# Patient Record
Sex: Female | Born: 1980 | Race: White | Hispanic: No | Marital: Married | Smoking: Former smoker
Health system: Southern US, Community
[De-identification: ages and names within clinical notes are randomized; demographics above are authoritative.]

## PROBLEM LIST (undated history)

## (undated) DIAGNOSIS — F988 Other specified behavioral and emotional disorders with onset usually occurring in childhood and adolescence: Secondary | ICD-10-CM

## (undated) DIAGNOSIS — F32A Depression, unspecified: Secondary | ICD-10-CM

## (undated) DIAGNOSIS — B029 Zoster without complications: Secondary | ICD-10-CM

## (undated) DIAGNOSIS — F419 Anxiety disorder, unspecified: Secondary | ICD-10-CM

## (undated) DIAGNOSIS — F329 Major depressive disorder, single episode, unspecified: Secondary | ICD-10-CM

## (undated) DIAGNOSIS — G4700 Insomnia, unspecified: Secondary | ICD-10-CM

## (undated) HISTORY — DX: Depression, unspecified: F32.A

## (undated) HISTORY — DX: Other specified behavioral and emotional disorders with onset usually occurring in childhood and adolescence: F98.8

## (undated) HISTORY — DX: Zoster without complications: B02.9

## (undated) HISTORY — PX: TONSILLECTOMY: SUR1361

## (undated) HISTORY — PX: ABDOMINAL HYSTERECTOMY: SHX81

## (undated) HISTORY — DX: Anxiety disorder, unspecified: F41.9

## (undated) HISTORY — DX: Insomnia, unspecified: G47.00

---
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
## 2015-09-09 DIAGNOSIS — R21 Rash and other nonspecific skin eruption: Secondary | ICD-10-CM | POA: Diagnosis not present

## 2015-09-09 DIAGNOSIS — B029 Zoster without complications: Secondary | ICD-10-CM | POA: Diagnosis not present

## 2015-10-06 DIAGNOSIS — F909 Attention-deficit hyperactivity disorder, unspecified type: Secondary | ICD-10-CM | POA: Diagnosis not present

## 2015-10-06 DIAGNOSIS — I509 Heart failure, unspecified: Secondary | ICD-10-CM | POA: Diagnosis not present

## 2015-10-06 DIAGNOSIS — M545 Low back pain: Secondary | ICD-10-CM | POA: Diagnosis not present

## 2015-10-06 DIAGNOSIS — N200 Calculus of kidney: Secondary | ICD-10-CM | POA: Diagnosis not present

## 2015-10-06 DIAGNOSIS — S335XXA Sprain of ligaments of lumbar spine, initial encounter: Secondary | ICD-10-CM | POA: Diagnosis not present

## 2015-10-07 DIAGNOSIS — F909 Attention-deficit hyperactivity disorder, unspecified type: Secondary | ICD-10-CM | POA: Diagnosis not present

## 2015-10-07 DIAGNOSIS — M545 Low back pain: Secondary | ICD-10-CM | POA: Diagnosis not present

## 2015-10-18 DIAGNOSIS — F988 Other specified behavioral and emotional disorders with onset usually occurring in childhood and adolescence: Secondary | ICD-10-CM | POA: Diagnosis not present

## 2015-10-18 DIAGNOSIS — B029 Zoster without complications: Secondary | ICD-10-CM | POA: Diagnosis not present

## 2015-10-18 DIAGNOSIS — Z6822 Body mass index (BMI) 22.0-22.9, adult: Secondary | ICD-10-CM | POA: Diagnosis not present

## 2015-11-29 DIAGNOSIS — S025XXA Fracture of tooth (traumatic), initial encounter for closed fracture: Secondary | ICD-10-CM | POA: Diagnosis not present

## 2015-12-11 DIAGNOSIS — F411 Generalized anxiety disorder: Secondary | ICD-10-CM | POA: Diagnosis not present

## 2015-12-11 DIAGNOSIS — F988 Other specified behavioral and emotional disorders with onset usually occurring in childhood and adolescence: Secondary | ICD-10-CM | POA: Diagnosis not present

## 2015-12-11 DIAGNOSIS — F329 Major depressive disorder, single episode, unspecified: Secondary | ICD-10-CM | POA: Diagnosis not present

## 2015-12-11 DIAGNOSIS — Z6823 Body mass index (BMI) 23.0-23.9, adult: Secondary | ICD-10-CM | POA: Diagnosis not present

## 2015-12-25 DIAGNOSIS — J209 Acute bronchitis, unspecified: Secondary | ICD-10-CM | POA: Diagnosis not present

## 2016-01-01 DIAGNOSIS — B029 Zoster without complications: Secondary | ICD-10-CM | POA: Diagnosis not present

## 2016-01-01 DIAGNOSIS — F329 Major depressive disorder, single episode, unspecified: Secondary | ICD-10-CM | POA: Diagnosis not present

## 2016-01-01 DIAGNOSIS — F411 Generalized anxiety disorder: Secondary | ICD-10-CM | POA: Diagnosis not present

## 2016-01-01 DIAGNOSIS — F988 Other specified behavioral and emotional disorders with onset usually occurring in childhood and adolescence: Secondary | ICD-10-CM | POA: Diagnosis not present

## 2016-01-17 DIAGNOSIS — F988 Other specified behavioral and emotional disorders with onset usually occurring in childhood and adolescence: Secondary | ICD-10-CM | POA: Diagnosis not present

## 2016-01-17 DIAGNOSIS — M549 Dorsalgia, unspecified: Secondary | ICD-10-CM | POA: Diagnosis not present

## 2016-01-17 DIAGNOSIS — Z6823 Body mass index (BMI) 23.0-23.9, adult: Secondary | ICD-10-CM | POA: Diagnosis not present

## 2016-01-17 DIAGNOSIS — F418 Other specified anxiety disorders: Secondary | ICD-10-CM | POA: Diagnosis not present

## 2016-01-31 DIAGNOSIS — S335XXA Sprain of ligaments of lumbar spine, initial encounter: Secondary | ICD-10-CM | POA: Diagnosis not present

## 2016-02-02 DIAGNOSIS — B029 Zoster without complications: Secondary | ICD-10-CM | POA: Diagnosis not present

## 2016-02-02 DIAGNOSIS — R21 Rash and other nonspecific skin eruption: Secondary | ICD-10-CM | POA: Diagnosis not present

## 2016-02-26 DIAGNOSIS — F411 Generalized anxiety disorder: Secondary | ICD-10-CM | POA: Diagnosis not present

## 2016-02-26 DIAGNOSIS — F329 Major depressive disorder, single episode, unspecified: Secondary | ICD-10-CM | POA: Diagnosis not present

## 2016-02-26 DIAGNOSIS — F988 Other specified behavioral and emotional disorders with onset usually occurring in childhood and adolescence: Secondary | ICD-10-CM | POA: Diagnosis not present

## 2016-02-26 DIAGNOSIS — Z6823 Body mass index (BMI) 23.0-23.9, adult: Secondary | ICD-10-CM | POA: Diagnosis not present

## 2016-03-08 DIAGNOSIS — S93401A Sprain of unspecified ligament of right ankle, initial encounter: Secondary | ICD-10-CM | POA: Diagnosis not present

## 2016-03-10 DIAGNOSIS — S93401D Sprain of unspecified ligament of right ankle, subsequent encounter: Secondary | ICD-10-CM | POA: Diagnosis not present

## 2016-03-15 DIAGNOSIS — S93401A Sprain of unspecified ligament of right ankle, initial encounter: Secondary | ICD-10-CM | POA: Diagnosis not present

## 2016-03-15 DIAGNOSIS — S8991XA Unspecified injury of right lower leg, initial encounter: Secondary | ICD-10-CM | POA: Diagnosis not present

## 2016-03-20 DIAGNOSIS — S82811A Torus fracture of upper end of right fibula, initial encounter for closed fracture: Secondary | ICD-10-CM | POA: Diagnosis not present

## 2016-03-20 DIAGNOSIS — S93401A Sprain of unspecified ligament of right ankle, initial encounter: Secondary | ICD-10-CM | POA: Diagnosis not present

## 2016-03-28 DIAGNOSIS — S93401S Sprain of unspecified ligament of right ankle, sequela: Secondary | ICD-10-CM | POA: Diagnosis not present

## 2016-04-03 DIAGNOSIS — S93431D Sprain of tibiofibular ligament of right ankle, subsequent encounter: Secondary | ICD-10-CM | POA: Diagnosis not present

## 2016-04-03 DIAGNOSIS — S82811D Torus fracture of upper end of right fibula, subsequent encounter for fracture with routine healing: Secondary | ICD-10-CM | POA: Diagnosis not present

## 2016-09-04 DIAGNOSIS — J019 Acute sinusitis, unspecified: Secondary | ICD-10-CM | POA: Diagnosis not present

## 2016-09-04 DIAGNOSIS — Z6823 Body mass index (BMI) 23.0-23.9, adult: Secondary | ICD-10-CM | POA: Diagnosis not present

## 2016-09-17 DIAGNOSIS — S8991XA Unspecified injury of right lower leg, initial encounter: Secondary | ICD-10-CM | POA: Diagnosis not present

## 2016-09-17 DIAGNOSIS — M25561 Pain in right knee: Secondary | ICD-10-CM | POA: Diagnosis not present

## 2016-09-17 DIAGNOSIS — M1711 Unilateral primary osteoarthritis, right knee: Secondary | ICD-10-CM | POA: Diagnosis not present

## 2016-09-17 DIAGNOSIS — M7989 Other specified soft tissue disorders: Secondary | ICD-10-CM | POA: Diagnosis not present

## 2016-09-17 DIAGNOSIS — S8981XA Other specified injuries of right lower leg, initial encounter: Secondary | ICD-10-CM | POA: Diagnosis not present

## 2016-10-03 DIAGNOSIS — F988 Other specified behavioral and emotional disorders with onset usually occurring in childhood and adolescence: Secondary | ICD-10-CM | POA: Diagnosis not present

## 2016-10-03 DIAGNOSIS — F411 Generalized anxiety disorder: Secondary | ICD-10-CM | POA: Diagnosis not present

## 2016-10-03 DIAGNOSIS — Z6823 Body mass index (BMI) 23.0-23.9, adult: Secondary | ICD-10-CM | POA: Diagnosis not present

## 2016-10-22 DIAGNOSIS — S82831D Other fracture of upper and lower end of right fibula, subsequent encounter for closed fracture with routine healing: Secondary | ICD-10-CM | POA: Diagnosis not present

## 2016-10-26 DIAGNOSIS — J029 Acute pharyngitis, unspecified: Secondary | ICD-10-CM | POA: Diagnosis not present

## 2016-10-26 DIAGNOSIS — J209 Acute bronchitis, unspecified: Secondary | ICD-10-CM | POA: Diagnosis not present

## 2016-11-28 DIAGNOSIS — F411 Generalized anxiety disorder: Secondary | ICD-10-CM | POA: Diagnosis not present

## 2016-11-28 DIAGNOSIS — B029 Zoster without complications: Secondary | ICD-10-CM | POA: Diagnosis not present

## 2016-11-28 DIAGNOSIS — F988 Other specified behavioral and emotional disorders with onset usually occurring in childhood and adolescence: Secondary | ICD-10-CM | POA: Diagnosis not present

## 2016-11-28 DIAGNOSIS — Z6822 Body mass index (BMI) 22.0-22.9, adult: Secondary | ICD-10-CM | POA: Diagnosis not present

## 2016-12-01 DIAGNOSIS — R51 Headache: Secondary | ICD-10-CM | POA: Diagnosis not present

## 2016-12-01 DIAGNOSIS — F332 Major depressive disorder, recurrent severe without psychotic features: Secondary | ICD-10-CM | POA: Diagnosis not present

## 2016-12-01 DIAGNOSIS — G4700 Insomnia, unspecified: Secondary | ICD-10-CM | POA: Diagnosis not present

## 2016-12-01 DIAGNOSIS — F322 Major depressive disorder, single episode, severe without psychotic features: Secondary | ICD-10-CM | POA: Diagnosis not present

## 2016-12-01 DIAGNOSIS — F17210 Nicotine dependence, cigarettes, uncomplicated: Secondary | ICD-10-CM | POA: Diagnosis not present

## 2016-12-01 DIAGNOSIS — Z7952 Long term (current) use of systemic steroids: Secondary | ICD-10-CM | POA: Diagnosis not present

## 2016-12-01 DIAGNOSIS — F909 Attention-deficit hyperactivity disorder, unspecified type: Secondary | ICD-10-CM | POA: Diagnosis not present

## 2016-12-01 DIAGNOSIS — F329 Major depressive disorder, single episode, unspecified: Secondary | ICD-10-CM | POA: Diagnosis not present

## 2016-12-01 DIAGNOSIS — R440 Auditory hallucinations: Secondary | ICD-10-CM | POA: Diagnosis not present

## 2016-12-01 DIAGNOSIS — Z79899 Other long term (current) drug therapy: Secondary | ICD-10-CM | POA: Diagnosis not present

## 2016-12-01 DIAGNOSIS — Z882 Allergy status to sulfonamides status: Secondary | ICD-10-CM | POA: Diagnosis not present

## 2016-12-01 DIAGNOSIS — Z880 Allergy status to penicillin: Secondary | ICD-10-CM | POA: Diagnosis not present

## 2016-12-02 DIAGNOSIS — G4700 Insomnia, unspecified: Secondary | ICD-10-CM | POA: Diagnosis not present

## 2016-12-02 DIAGNOSIS — F909 Attention-deficit hyperactivity disorder, unspecified type: Secondary | ICD-10-CM | POA: Diagnosis not present

## 2016-12-02 DIAGNOSIS — F332 Major depressive disorder, recurrent severe without psychotic features: Secondary | ICD-10-CM | POA: Diagnosis not present

## 2016-12-05 DIAGNOSIS — F332 Major depressive disorder, recurrent severe without psychotic features: Secondary | ICD-10-CM | POA: Diagnosis not present

## 2016-12-12 DIAGNOSIS — F988 Other specified behavioral and emotional disorders with onset usually occurring in childhood and adolescence: Secondary | ICD-10-CM | POA: Diagnosis not present

## 2016-12-12 DIAGNOSIS — F332 Major depressive disorder, recurrent severe without psychotic features: Secondary | ICD-10-CM | POA: Diagnosis not present

## 2016-12-19 DIAGNOSIS — F332 Major depressive disorder, recurrent severe without psychotic features: Secondary | ICD-10-CM | POA: Diagnosis not present

## 2016-12-20 DIAGNOSIS — F332 Major depressive disorder, recurrent severe without psychotic features: Secondary | ICD-10-CM | POA: Diagnosis not present

## 2016-12-26 DIAGNOSIS — F332 Major depressive disorder, recurrent severe without psychotic features: Secondary | ICD-10-CM | POA: Diagnosis not present

## 2017-01-10 DIAGNOSIS — F332 Major depressive disorder, recurrent severe without psychotic features: Secondary | ICD-10-CM | POA: Diagnosis not present

## 2017-01-19 DIAGNOSIS — A09 Infectious gastroenteritis and colitis, unspecified: Secondary | ICD-10-CM | POA: Diagnosis not present

## 2017-01-21 DIAGNOSIS — G4700 Insomnia, unspecified: Secondary | ICD-10-CM | POA: Diagnosis not present

## 2017-01-21 DIAGNOSIS — R51 Headache: Secondary | ICD-10-CM | POA: Diagnosis not present

## 2017-01-21 DIAGNOSIS — F411 Generalized anxiety disorder: Secondary | ICD-10-CM | POA: Diagnosis not present

## 2017-01-21 DIAGNOSIS — F988 Other specified behavioral and emotional disorders with onset usually occurring in childhood and adolescence: Secondary | ICD-10-CM | POA: Diagnosis not present

## 2017-01-23 DIAGNOSIS — F332 Major depressive disorder, recurrent severe without psychotic features: Secondary | ICD-10-CM | POA: Diagnosis not present

## 2017-02-06 DIAGNOSIS — J0100 Acute maxillary sinusitis, unspecified: Secondary | ICD-10-CM | POA: Diagnosis not present

## 2017-03-11 DIAGNOSIS — F988 Other specified behavioral and emotional disorders with onset usually occurring in childhood and adolescence: Secondary | ICD-10-CM | POA: Diagnosis not present

## 2017-03-11 DIAGNOSIS — R51 Headache: Secondary | ICD-10-CM | POA: Diagnosis not present

## 2017-03-11 DIAGNOSIS — G4700 Insomnia, unspecified: Secondary | ICD-10-CM | POA: Diagnosis not present

## 2017-03-11 DIAGNOSIS — F411 Generalized anxiety disorder: Secondary | ICD-10-CM | POA: Diagnosis not present

## 2017-04-30 DIAGNOSIS — G4700 Insomnia, unspecified: Secondary | ICD-10-CM | POA: Diagnosis not present

## 2017-04-30 DIAGNOSIS — F988 Other specified behavioral and emotional disorders with onset usually occurring in childhood and adolescence: Secondary | ICD-10-CM | POA: Diagnosis not present

## 2017-04-30 DIAGNOSIS — F411 Generalized anxiety disorder: Secondary | ICD-10-CM | POA: Diagnosis not present

## 2017-04-30 DIAGNOSIS — R51 Headache: Secondary | ICD-10-CM | POA: Diagnosis not present

## 2017-05-04 DIAGNOSIS — J019 Acute sinusitis, unspecified: Secondary | ICD-10-CM | POA: Diagnosis not present

## 2017-05-08 DIAGNOSIS — R05 Cough: Secondary | ICD-10-CM | POA: Diagnosis not present

## 2017-05-08 DIAGNOSIS — Z6824 Body mass index (BMI) 24.0-24.9, adult: Secondary | ICD-10-CM | POA: Diagnosis not present

## 2017-05-08 DIAGNOSIS — J069 Acute upper respiratory infection, unspecified: Secondary | ICD-10-CM | POA: Diagnosis not present

## 2017-06-18 DIAGNOSIS — H60511 Acute actinic otitis externa, right ear: Secondary | ICD-10-CM | POA: Diagnosis not present

## 2017-07-08 DIAGNOSIS — F411 Generalized anxiety disorder: Secondary | ICD-10-CM | POA: Diagnosis not present

## 2017-07-08 DIAGNOSIS — F988 Other specified behavioral and emotional disorders with onset usually occurring in childhood and adolescence: Secondary | ICD-10-CM | POA: Diagnosis not present

## 2017-07-08 DIAGNOSIS — Z6824 Body mass index (BMI) 24.0-24.9, adult: Secondary | ICD-10-CM | POA: Diagnosis not present

## 2017-08-22 DIAGNOSIS — R102 Pelvic and perineal pain: Secondary | ICD-10-CM | POA: Diagnosis not present

## 2017-08-22 DIAGNOSIS — S300XXA Contusion of lower back and pelvis, initial encounter: Secondary | ICD-10-CM | POA: Diagnosis not present

## 2017-08-26 DIAGNOSIS — S300XXA Contusion of lower back and pelvis, initial encounter: Secondary | ICD-10-CM | POA: Diagnosis not present

## 2017-09-01 DIAGNOSIS — M62830 Muscle spasm of back: Secondary | ICD-10-CM | POA: Diagnosis not present

## 2017-09-01 DIAGNOSIS — Z6824 Body mass index (BMI) 24.0-24.9, adult: Secondary | ICD-10-CM | POA: Diagnosis not present

## 2017-09-01 DIAGNOSIS — M545 Low back pain: Secondary | ICD-10-CM | POA: Diagnosis not present

## 2017-09-09 DIAGNOSIS — R112 Nausea with vomiting, unspecified: Secondary | ICD-10-CM | POA: Diagnosis not present

## 2017-09-17 DIAGNOSIS — N390 Urinary tract infection, site not specified: Secondary | ICD-10-CM | POA: Diagnosis not present

## 2017-09-18 DIAGNOSIS — R109 Unspecified abdominal pain: Secondary | ICD-10-CM | POA: Diagnosis not present

## 2017-09-18 DIAGNOSIS — R110 Nausea: Secondary | ICD-10-CM | POA: Diagnosis not present

## 2017-09-18 DIAGNOSIS — R1032 Left lower quadrant pain: Secondary | ICD-10-CM | POA: Diagnosis not present

## 2017-09-24 DIAGNOSIS — F411 Generalized anxiety disorder: Secondary | ICD-10-CM | POA: Diagnosis not present

## 2017-09-24 DIAGNOSIS — F988 Other specified behavioral and emotional disorders with onset usually occurring in childhood and adolescence: Secondary | ICD-10-CM | POA: Diagnosis not present

## 2017-09-24 DIAGNOSIS — N200 Calculus of kidney: Secondary | ICD-10-CM | POA: Diagnosis not present

## 2017-09-24 DIAGNOSIS — R109 Unspecified abdominal pain: Secondary | ICD-10-CM | POA: Diagnosis not present

## 2017-09-29 DIAGNOSIS — R350 Frequency of micturition: Secondary | ICD-10-CM | POA: Diagnosis not present

## 2017-09-29 DIAGNOSIS — R300 Dysuria: Secondary | ICD-10-CM | POA: Diagnosis not present

## 2017-09-29 DIAGNOSIS — R109 Unspecified abdominal pain: Secondary | ICD-10-CM | POA: Diagnosis not present

## 2017-10-17 DIAGNOSIS — H16143 Punctate keratitis, bilateral: Secondary | ICD-10-CM | POA: Diagnosis not present

## 2017-12-08 DIAGNOSIS — M25561 Pain in right knee: Secondary | ICD-10-CM | POA: Diagnosis not present

## 2017-12-08 DIAGNOSIS — M25571 Pain in right ankle and joints of right foot: Secondary | ICD-10-CM | POA: Diagnosis not present

## 2017-12-08 DIAGNOSIS — M79605 Pain in left leg: Secondary | ICD-10-CM | POA: Diagnosis not present

## 2017-12-13 DIAGNOSIS — M25561 Pain in right knee: Secondary | ICD-10-CM | POA: Diagnosis not present

## 2017-12-13 DIAGNOSIS — M7121 Synovial cyst of popliteal space [Baker], right knee: Secondary | ICD-10-CM | POA: Diagnosis not present

## 2017-12-13 DIAGNOSIS — M25461 Effusion, right knee: Secondary | ICD-10-CM | POA: Diagnosis not present

## 2017-12-16 DIAGNOSIS — F411 Generalized anxiety disorder: Secondary | ICD-10-CM | POA: Diagnosis not present

## 2017-12-16 DIAGNOSIS — J019 Acute sinusitis, unspecified: Secondary | ICD-10-CM | POA: Diagnosis not present

## 2017-12-16 DIAGNOSIS — Z1331 Encounter for screening for depression: Secondary | ICD-10-CM | POA: Diagnosis not present

## 2017-12-16 DIAGNOSIS — Z6825 Body mass index (BMI) 25.0-25.9, adult: Secondary | ICD-10-CM | POA: Diagnosis not present

## 2017-12-16 DIAGNOSIS — F988 Other specified behavioral and emotional disorders with onset usually occurring in childhood and adolescence: Secondary | ICD-10-CM | POA: Diagnosis not present

## 2018-01-02 DIAGNOSIS — J209 Acute bronchitis, unspecified: Secondary | ICD-10-CM | POA: Diagnosis not present

## 2018-01-02 DIAGNOSIS — J019 Acute sinusitis, unspecified: Secondary | ICD-10-CM | POA: Diagnosis not present

## 2018-01-09 DIAGNOSIS — R110 Nausea: Secondary | ICD-10-CM | POA: Diagnosis not present

## 2018-02-23 DIAGNOSIS — S60012A Contusion of left thumb without damage to nail, initial encounter: Secondary | ICD-10-CM | POA: Diagnosis not present

## 2018-02-23 DIAGNOSIS — M79642 Pain in left hand: Secondary | ICD-10-CM | POA: Diagnosis not present

## 2018-02-25 DIAGNOSIS — E785 Hyperlipidemia, unspecified: Secondary | ICD-10-CM | POA: Diagnosis not present

## 2018-02-25 DIAGNOSIS — F411 Generalized anxiety disorder: Secondary | ICD-10-CM | POA: Diagnosis not present

## 2018-02-25 DIAGNOSIS — F988 Other specified behavioral and emotional disorders with onset usually occurring in childhood and adolescence: Secondary | ICD-10-CM | POA: Diagnosis not present

## 2018-02-25 DIAGNOSIS — Z79899 Other long term (current) drug therapy: Secondary | ICD-10-CM | POA: Diagnosis not present

## 2018-03-09 DIAGNOSIS — J209 Acute bronchitis, unspecified: Secondary | ICD-10-CM | POA: Diagnosis not present

## 2018-03-27 DIAGNOSIS — R110 Nausea: Secondary | ICD-10-CM | POA: Diagnosis not present

## 2018-04-30 DIAGNOSIS — K5730 Diverticulosis of large intestine without perforation or abscess without bleeding: Secondary | ICD-10-CM | POA: Diagnosis not present

## 2018-04-30 DIAGNOSIS — R1032 Left lower quadrant pain: Secondary | ICD-10-CM | POA: Diagnosis not present

## 2018-04-30 DIAGNOSIS — R509 Fever, unspecified: Secondary | ICD-10-CM | POA: Diagnosis not present

## 2018-04-30 DIAGNOSIS — R110 Nausea: Secondary | ICD-10-CM | POA: Diagnosis not present

## 2018-04-30 DIAGNOSIS — N200 Calculus of kidney: Secondary | ICD-10-CM | POA: Diagnosis not present

## 2018-04-30 DIAGNOSIS — R102 Pelvic and perineal pain: Secondary | ICD-10-CM | POA: Diagnosis not present

## 2018-04-30 DIAGNOSIS — K760 Fatty (change of) liver, not elsewhere classified: Secondary | ICD-10-CM | POA: Diagnosis not present

## 2018-05-01 DIAGNOSIS — R110 Nausea: Secondary | ICD-10-CM | POA: Diagnosis not present

## 2018-05-04 DIAGNOSIS — Z6827 Body mass index (BMI) 27.0-27.9, adult: Secondary | ICD-10-CM | POA: Diagnosis not present

## 2018-05-04 DIAGNOSIS — R109 Unspecified abdominal pain: Secondary | ICD-10-CM | POA: Diagnosis not present

## 2018-05-21 DIAGNOSIS — F411 Generalized anxiety disorder: Secondary | ICD-10-CM | POA: Diagnosis not present

## 2018-05-21 DIAGNOSIS — R110 Nausea: Secondary | ICD-10-CM | POA: Diagnosis not present

## 2018-05-21 DIAGNOSIS — F988 Other specified behavioral and emotional disorders with onset usually occurring in childhood and adolescence: Secondary | ICD-10-CM | POA: Diagnosis not present

## 2018-05-21 DIAGNOSIS — R109 Unspecified abdominal pain: Secondary | ICD-10-CM | POA: Diagnosis not present

## 2018-05-22 DIAGNOSIS — Z01419 Encounter for gynecological examination (general) (routine) without abnormal findings: Secondary | ICD-10-CM | POA: Diagnosis not present

## 2018-05-22 DIAGNOSIS — R1032 Left lower quadrant pain: Secondary | ICD-10-CM | POA: Diagnosis not present

## 2018-05-22 DIAGNOSIS — M533 Sacrococcygeal disorders, not elsewhere classified: Secondary | ICD-10-CM | POA: Diagnosis not present

## 2018-05-22 DIAGNOSIS — Z90711 Acquired absence of uterus with remaining cervical stump: Secondary | ICD-10-CM | POA: Diagnosis not present

## 2018-05-26 DIAGNOSIS — N809 Endometriosis, unspecified: Secondary | ICD-10-CM | POA: Diagnosis not present

## 2018-05-26 DIAGNOSIS — M533 Sacrococcygeal disorders, not elsewhere classified: Secondary | ICD-10-CM | POA: Diagnosis not present

## 2018-05-26 DIAGNOSIS — R1032 Left lower quadrant pain: Secondary | ICD-10-CM | POA: Diagnosis not present

## 2018-05-26 DIAGNOSIS — R102 Pelvic and perineal pain: Secondary | ICD-10-CM | POA: Diagnosis not present

## 2018-05-28 DIAGNOSIS — R110 Nausea: Secondary | ICD-10-CM | POA: Diagnosis not present

## 2018-06-03 DIAGNOSIS — N809 Endometriosis, unspecified: Secondary | ICD-10-CM | POA: Diagnosis not present

## 2018-06-03 DIAGNOSIS — Z6827 Body mass index (BMI) 27.0-27.9, adult: Secondary | ICD-10-CM | POA: Diagnosis not present

## 2018-06-12 DIAGNOSIS — N804 Endometriosis of rectovaginal septum and vagina: Secondary | ICD-10-CM | POA: Diagnosis not present

## 2018-06-12 DIAGNOSIS — Z90710 Acquired absence of both cervix and uterus: Secondary | ICD-10-CM | POA: Diagnosis not present

## 2018-06-12 DIAGNOSIS — G8929 Other chronic pain: Secondary | ICD-10-CM | POA: Diagnosis not present

## 2018-06-12 DIAGNOSIS — N83202 Unspecified ovarian cyst, left side: Secondary | ICD-10-CM | POA: Diagnosis not present

## 2018-06-12 DIAGNOSIS — Z90721 Acquired absence of ovaries, unilateral: Secondary | ICD-10-CM | POA: Diagnosis not present

## 2018-06-12 DIAGNOSIS — N809 Endometriosis, unspecified: Secondary | ICD-10-CM | POA: Diagnosis not present

## 2018-06-13 DIAGNOSIS — N83202 Unspecified ovarian cyst, left side: Secondary | ICD-10-CM | POA: Diagnosis not present

## 2018-06-13 DIAGNOSIS — N804 Endometriosis of rectovaginal septum and vagina: Secondary | ICD-10-CM | POA: Diagnosis not present

## 2018-06-13 DIAGNOSIS — G8929 Other chronic pain: Secondary | ICD-10-CM | POA: Diagnosis not present

## 2018-06-22 DIAGNOSIS — H9209 Otalgia, unspecified ear: Secondary | ICD-10-CM | POA: Diagnosis not present

## 2018-06-24 DIAGNOSIS — R102 Pelvic and perineal pain: Secondary | ICD-10-CM | POA: Diagnosis not present

## 2018-06-24 DIAGNOSIS — N809 Endometriosis, unspecified: Secondary | ICD-10-CM | POA: Diagnosis not present

## 2018-07-08 DIAGNOSIS — R102 Pelvic and perineal pain: Secondary | ICD-10-CM | POA: Diagnosis not present

## 2018-07-27 DIAGNOSIS — A09 Infectious gastroenteritis and colitis, unspecified: Secondary | ICD-10-CM | POA: Diagnosis not present

## 2018-08-04 DIAGNOSIS — F411 Generalized anxiety disorder: Secondary | ICD-10-CM | POA: Diagnosis not present

## 2018-08-04 DIAGNOSIS — R635 Abnormal weight gain: Secondary | ICD-10-CM | POA: Diagnosis not present

## 2018-08-04 DIAGNOSIS — Z6827 Body mass index (BMI) 27.0-27.9, adult: Secondary | ICD-10-CM | POA: Diagnosis not present

## 2018-08-04 DIAGNOSIS — F988 Other specified behavioral and emotional disorders with onset usually occurring in childhood and adolescence: Secondary | ICD-10-CM | POA: Diagnosis not present

## 2018-08-07 DIAGNOSIS — Y999 Unspecified external cause status: Secondary | ICD-10-CM | POA: Diagnosis not present

## 2018-08-07 DIAGNOSIS — S61512A Laceration without foreign body of left wrist, initial encounter: Secondary | ICD-10-CM | POA: Diagnosis not present

## 2018-08-07 DIAGNOSIS — W25XXXA Contact with sharp glass, initial encounter: Secondary | ICD-10-CM | POA: Diagnosis not present

## 2018-08-07 DIAGNOSIS — S61213A Laceration without foreign body of left middle finger without damage to nail, initial encounter: Secondary | ICD-10-CM | POA: Diagnosis not present

## 2018-08-10 DIAGNOSIS — Z6828 Body mass index (BMI) 28.0-28.9, adult: Secondary | ICD-10-CM | POA: Diagnosis not present

## 2018-08-10 DIAGNOSIS — S61512S Laceration without foreign body of left wrist, sequela: Secondary | ICD-10-CM | POA: Diagnosis not present

## 2018-08-18 DIAGNOSIS — Z6827 Body mass index (BMI) 27.0-27.9, adult: Secondary | ICD-10-CM | POA: Diagnosis not present

## 2018-08-18 DIAGNOSIS — S61512S Laceration without foreign body of left wrist, sequela: Secondary | ICD-10-CM | POA: Diagnosis not present

## 2018-08-27 DIAGNOSIS — S61512A Laceration without foreign body of left wrist, initial encounter: Secondary | ICD-10-CM | POA: Diagnosis not present

## 2018-10-26 DIAGNOSIS — G5601 Carpal tunnel syndrome, right upper limb: Secondary | ICD-10-CM | POA: Diagnosis not present

## 2018-10-26 DIAGNOSIS — M2550 Pain in unspecified joint: Secondary | ICD-10-CM | POA: Diagnosis not present

## 2018-10-26 DIAGNOSIS — F988 Other specified behavioral and emotional disorders with onset usually occurring in childhood and adolescence: Secondary | ICD-10-CM | POA: Diagnosis not present

## 2018-10-26 DIAGNOSIS — F411 Generalized anxiety disorder: Secondary | ICD-10-CM | POA: Diagnosis not present

## 2018-11-09 DIAGNOSIS — K029 Dental caries, unspecified: Secondary | ICD-10-CM | POA: Diagnosis not present

## 2018-12-09 DIAGNOSIS — J0110 Acute frontal sinusitis, unspecified: Secondary | ICD-10-CM | POA: Diagnosis not present

## 2019-08-26 NOTE — Progress Notes (Signed)
Office Visit Note  Patient: Tiffany Swanson             Date of Birth: 06/03/1980           MRN: [id number]             PCP: Janine Limbo, PA-C Referring: Janine Limbo, PA-C Visit Date: 08/31/2019 Occupation: @GUAROCC @  Subjective:  Pain and swelling in multiple joints.   History of Present Illness: Tiffany Swanson is a 39 y.o. female seen in consultation per request of her PCP.  According to patient her symptoms are started about 1 year ago with pain and swelling in her feet.  She states the pain will get worse when she will walk or stand for a long time.  3 months later she started having pain and swelling in her hands.  And couple of months later she started having swelling in her knee joints.  She states that the time she was referred to [facility] who did x-rays and advised her to follow-up with a rheumatologist.  She states in November she was given a prednisone taper which helped some but did not resolve her symptoms completely.  She also took tramadol which helped initially.  She continues to have pain in all of her joints.  She describes pain and swelling in her bilateral hands and bilateral ankles and her bilateral knee joints.  She also has discomfort in her hip joints and her knees.  She gives history of Raynaud's phenomenon.  There is also history of rheumatoid arthritis in her mother and maternal grandmother.  Activities of Daily Living:  Patient reports morning stiffness for 2-3 hours.   Patient Reports nocturnal pain.  Difficulty dressing/grooming: Reports Difficulty climbing stairs: Reports Difficulty getting out of chair: Reports Difficulty using hands for taps, buttons, cutlery, and/or writing: Reports  Review of Systems  Constitutional: Positive for fatigue. Negative for night sweats, weight gain and weight loss.  HENT: Positive for mouth dryness. Negative for mouth sores, trouble swallowing, trouble swallowing and nose dryness.   Eyes: Positive for dryness.  Negative for pain, redness, itching and visual disturbance.  Respiratory: Negative for cough, shortness of breath and difficulty breathing.   Cardiovascular: Negative for chest pain, palpitations, hypertension, irregular heartbeat and swelling in legs/feet.  Gastrointestinal: Positive for blood in stool and constipation. Negative for diarrhea.       History of hemorrhoids  Endocrine: Negative for increased urination.  Genitourinary: Negative for difficulty urinating, painful urination and vaginal dryness.  Musculoskeletal: Positive for arthralgias, joint pain, joint swelling and morning stiffness. Negative for myalgias, muscle weakness, muscle tenderness and myalgias.  Skin: Positive for color change. Negative for rash, hair loss, redness, skin tightness, ulcers and sensitivity to sunlight.  Allergic/Immunologic: Negative for susceptible to infections.  Neurological: Positive for numbness and memory loss. Negative for dizziness, headaches, night sweats and weakness.  Hematological: Positive for bruising/bleeding tendency. Negative for swollen glands.  Psychiatric/Behavioral: Positive for depressed mood and sleep disturbance. Negative for confusion. The patient is nervous/anxious.     PMFS History:  There are no problems to display for this patient.   Past Medical History:  Diagnosis Date  . ADD (attention deficit disorder)   . Anxiety   . Depression   . Insomnia   . Shingles     Family History  Problem Relation Age of Onset  . Rheum arthritis Mother   . Liver disease Mother   . Kidney disease Mother   . Diabetes Mother   .  COPD Father   . Hypertension Father   . Osteoarthritis Father   . Rheum arthritis Sister   . Osteoarthritis Sister   . Healthy Daughter   . Healthy Son    Past Surgical History:  Procedure Laterality Date  . ABDOMINAL HYSTERECTOMY     2017, 2020  . TONSILLECTOMY     age 11   Social History   Social History Narrative  . Not on file    There is no  immunization history on file for this patient.   Objective: Vital Signs: BP 101/63 (BP Location: Right Arm, Patient Position: Sitting, Cuff Size: Normal)   Pulse 80   Resp 14   Ht 5' 6.75" (1.695 m)   Wt 178 lb (80.7 kg)   BMI 28.09 kg/m    Physical Exam Vitals and nursing note reviewed.  Constitutional:      Appearance: She is well-developed.  HENT:     Head: Normocephalic and atraumatic.  Eyes:     Conjunctiva/sclera: Conjunctivae normal.  Cardiovascular:     Rate and Rhythm: Normal rate and regular rhythm.     Heart sounds: Normal heart sounds.  Pulmonary:     Effort: Pulmonary effort is normal.     Breath sounds: Normal breath sounds.  Abdominal:     General: Bowel sounds are normal.     Palpations: Abdomen is soft.  Musculoskeletal:     Cervical back: Normal range of motion.  Lymphadenopathy:     Cervical: No cervical adenopathy.  Skin:    General: Skin is warm and dry.     Capillary Refill: Capillary refill takes less than 2 seconds.  Neurological:     Mental Status: She is alert and oriented to person, place, and time.  Psychiatric:        Behavior: Behavior normal.      Musculoskeletal Exam: She had discomfort range of motion of her cervical spine.  She had discomfort range of motion of bilateral shoulders.  She had tenderness over bilateral wrist joints.  She has synovitis over several of her MCPs as described below.  Tenderness over PIPs.  She has effusion in her bilateral knee joints.  She had tenderness over ankle joints and swelling over MTPs as described below.  CDAI Exam: CDAI Score: 25.6  Patient Global: 8 mm; Provider Global: 8 mm Swollen: 8 ; Tender: 33  Joint Exam 08/31/2019      Right  Left  Glenohumeral   Tender   Tender  Wrist   Tender   Tender  MCP 2  Swollen Tender  Swollen Tender  MCP 3   Tender   Tender  MCP 4   Tender   Tender  IP   Tender   Tender  PIP 2   Tender   Tender  PIP 3   Tender   Tender  PIP 4   Tender   Tender  Hip    Tender   Tender  Knee  Swollen Tender  Swollen Tender  Ankle   Tender   Tender  MTP 1  Swollen Tender  Swollen Tender  MTP 2   Tender  Swollen Tender  MTP 3  Swollen Tender   Tender  MTP 4   Tender   Tender  MTP 5      Tender     Investigation: No additional findings.  Imaging: XR Foot 2 Views Left  Result Date: 08/31/2019 Mild first MTP narrowing was noted.  No other MTP involvement, PIP or DIP narrowing  was noted.  No intertarsal or tibiotalar joint space narrowing was noted.  No erosive changes were noted. Impression: These findings are consistent with osteoarthritis of the foot.  XR Foot 2 Views Right  Result Date: 08/31/2019 Mild first MTP narrowing was noted.  No other MTP involvement, PIP or DIP narrowing was noted.  No intertarsal tibiotalar joint space narrowing was noted.  No erosive changes were noted. Impression: These findings are consistent with osteoarthritis of the foot.  XR Hand 2 View Left  Result Date: 08/31/2019 Juxta-articular osteopenia was noted.  No MCP, PIP or DIP narrowing was noted.  No intercarpal or radiocarpal joint space narrowing was noted.  No erosive changes were noted. Impression: These findings are consistent with early rheumatoid arthritis.  XR Hand 2 View Right  Result Date: 08/31/2019 Juxta-articular osteopenia was noted.  No MCP, PIP or DIP narrowing was noted.  No intercarpal or radiocarpal joint space narrowing was noted.  No erosive changes were noted. Impression: These findings are consistent with early rheumatoid arthritis.  XR KNEE 3 VIEW LEFT  Result Date: 08/31/2019 Moderate medial compartment narrowing and mild patellofemoral narrowing was noted.  No chondrocalcinosis was noted.  Effusion was noted. Impression: These findings are consistent with moderate osteoarthritis and inflammatory arthritis.  XR KNEE 3 VIEW RIGHT  Result Date: 08/31/2019 Moderate medial compartment narrowing and mild patellofemoral narrowing was noted.  No  chondrocalcinosis was noted.  Effusion around knee joint was noted. 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   Recent Labs: No results found for: WBC, HGB, PLT, NA, K, CL, CO2, GLUCOSE, BUN, CREATININE, BILITOT, ALKPHOS, AST, ALT, PROT, ALBUMIN, CALCIUM, GFRAA, QFTBGOLD, QFTBGOLDPLUS  Speciality Comments: No specialty comments available.  Procedures:  No procedures performed Allergies: Penicillins, Sulfa antibiotics, and Ketorolac   Assessment / Plan:     Visit Diagnoses: Chronic inflammatory arthritis -patient has severe inflammatory arthritis involving multiple joints.  Most likely diagnosis is rheumatoid arthritis.  I will obtain labs today.  Plan: Urinalysis, Routine w reflex microscopic, Sedimentation rate, Rheumatoid factor, Cyclic citrul peptide antibody, IgG, 14-3-3 eta Protein, ANA, HLA-B27 antigen.  There is history of rheumatoid arthritis in her mother and maternal grandmother.  Most likely diagnosis is rheumatoid arthritis.  I will give her prednisone as a bridging therapy starting at 20 mg and taper by 5 mg every week.  I also plan to start her on methotrexate as soon as her labs are available.  The plan will be to start her on 0.6 mL subcu weekly and increase it to 0.8 mL subcu weekly along with folic acid 20 mg p.o. daily.  If she has an adequate response over a month I may add a biologic agent due to aggressive nature of her arthritis.  Pain in both hands -she has synovitis in bilateral hands as described above.  Plan: XR Hand 2 View Right, XR Hand 2 View Left.  X-rays were consistent with inflammatory arthritis as patient had juxta-articular osteopenia.  No erosive changes were noted.  Chronic pain of both knees -she has warmth swelling and effusion in bilateral knee joints.  Plan: XR KNEE 3 VIEW RIGHT, XR KNEE 3 VIEW LEFT.  Bilateral moderate medial compartment narrowing and mild patellofemoral narrowing  was noted.  Effusion was also visualized in bilateral knee x-rays.  Pain in both feet -she has synovitis over MTPs.  Plan: XR Foot 2 Views Right, XR Foot 2 Views Left.  X-rays were consistent with osteoarthritis.  High risk medication use - Plan: CBC with Differential/Platelet, COMPLETE METABOLIC PANEL WITH GFR, Hepatitis B core antibody, IgM, Hepatitis B surface antigen, Hepatitis C antibody, HIV Antibody (routine testing w rflx), QuantiFERON-TB Gold Plus, Serum protein electrophoresis with reflex, IgG, IgA, IgM, DG Chest 2 View  Family history of rheumatoid arthritis - mother and maternal grand mother  Other medical problems are listed as follows:  Dyslipidemia  Other insomnia  GAD (generalized anxiety disorder)  History of depression  Attention deficit disorder, unspecified hyperactivity presence  History of shingles  Orders: Orders Placed This Encounter  Procedures  . XR Hand 2 View Right  . XR Hand 2 View Left  . XR KNEE 3 VIEW RIGHT  . XR KNEE 3 VIEW LEFT  . XR Foot 2 Views Right  . XR Foot 2 Views Left  . DG Chest 2 View  . CBC with Differential/Platelet  . COMPLETE METABOLIC PANEL WITH GFR  . Urinalysis, Routine w reflex microscopic  . Sedimentation rate  . Rheumatoid factor  . Cyclic citrul peptide antibody, IgG  . 14-3-3 eta Protein  . ANA  . Hepatitis B core antibody, IgM  . Hepatitis B surface antigen  . Hepatitis C antibody  . HIV Antibody (routine testing w rflx)  . QuantiFERON-TB Gold Plus  . Serum protein electrophoresis with reflex  . IgG, IgA, IgM  . HLA-B27 antigen   Meds ordered this encounter  Medications  . predniSONE (DELTASONE) 5 MG tablet    Sig: Take 4 tablets (20 mg total) by mouth daily with breakfast for 7 days, THEN 3 tablets (15 mg total) daily with breakfast for 7 days, THEN 2 tablets (10 mg total) daily with breakfast for 7 days, THEN 1 tablet (5 mg total) daily with breakfast for 7 days.    Dispense:  70 tablet    Refill:  0     Face-to-face time spent with patient was 50 minutes. Greater than 50% of time was spent in counseling and coordination of care.  Follow-Up Instructions: Return for Chronic inflammatory arthritis.   Bo Merino, MD  Note - This record has been created using Editor, commissioning.  Chart creation errors have been sought, but may not always  have been located. Such creation errors do not reflect on  the standard of medical care.

## 2019-08-31 ENCOUNTER — Ambulatory Visit: Payer: Self-pay

## 2019-08-31 ENCOUNTER — Ambulatory Visit (HOSPITAL_COMMUNITY)
Admission: RE | Admit: 2019-08-31 | Discharge: 2019-08-31 | Disposition: A | Discharge status: Home / Self Care | Payer: Managed Care, Other (non HMO) | Source: Ambulatory Visit | Attending: Rheumatology | Admitting: Rheumatology

## 2019-08-31 ENCOUNTER — Other Ambulatory Visit: Payer: Self-pay

## 2019-08-31 ENCOUNTER — Encounter: Payer: Self-pay | Admitting: Rheumatology

## 2019-08-31 ENCOUNTER — Ambulatory Visit (INDEPENDENT_AMBULATORY_CARE_PROVIDER_SITE_OTHER): Payer: Managed Care, Other (non HMO) | Admitting: Rheumatology

## 2019-08-31 VITALS — BP 101/63 | HR 80 | Resp 14 | Ht 66.75 in | Wt 178.0 lb

## 2019-08-31 DIAGNOSIS — M79641 Pain in right hand: Secondary | ICD-10-CM | POA: Diagnosis not present

## 2019-08-31 DIAGNOSIS — M25561 Pain in right knee: Secondary | ICD-10-CM | POA: Diagnosis not present

## 2019-08-31 DIAGNOSIS — Z8619 Personal history of other infectious and parasitic diseases: Secondary | ICD-10-CM

## 2019-08-31 DIAGNOSIS — E785 Hyperlipidemia, unspecified: Secondary | ICD-10-CM

## 2019-08-31 DIAGNOSIS — M79642 Pain in left hand: Secondary | ICD-10-CM | POA: Diagnosis not present

## 2019-08-31 DIAGNOSIS — F988 Other specified behavioral and emotional disorders with onset usually occurring in childhood and adolescence: Secondary | ICD-10-CM

## 2019-08-31 DIAGNOSIS — M25562 Pain in left knee: Secondary | ICD-10-CM

## 2019-08-31 DIAGNOSIS — Z79899 Other long term (current) drug therapy: Secondary | ICD-10-CM | POA: Insufficient documentation

## 2019-08-31 DIAGNOSIS — Z8261 Family history of arthritis: Secondary | ICD-10-CM

## 2019-08-31 DIAGNOSIS — M1990 Unspecified osteoarthritis, unspecified site: Secondary | ICD-10-CM

## 2019-08-31 DIAGNOSIS — M79671 Pain in right foot: Secondary | ICD-10-CM | POA: Diagnosis not present

## 2019-08-31 DIAGNOSIS — G8929 Other chronic pain: Secondary | ICD-10-CM

## 2019-08-31 DIAGNOSIS — Z8659 Personal history of other mental and behavioral disorders: Secondary | ICD-10-CM

## 2019-08-31 DIAGNOSIS — G4709 Other insomnia: Secondary | ICD-10-CM

## 2019-08-31 DIAGNOSIS — M79672 Pain in left foot: Secondary | ICD-10-CM

## 2019-08-31 DIAGNOSIS — F411 Generalized anxiety disorder: Secondary | ICD-10-CM

## 2019-08-31 MED ORDER — PREDNISONE 5 MG PO TABS: ORAL_TABLET | ORAL | 0 refills | Status: DC

## 2019-08-31 NOTE — Progress Notes (Signed)
Pharmacy Note  Subjective: Patient presents today to [facility] for follow up office visit. Patient seen by the pharmacist for counseling on methotrexate for inflammatory arthritis.  She has family history of rheumatoid arthritis. She is naive to therapy.  Objective: CBC Pending 08/31/2019  CMP  Pending 08/31/2019  Baseline Immunosuppressant Therapy Labs TB GOLD Pending 08/31/2019  Hepatitis Panel Pending 08/31/2019  HIV Pending 08/31/2019  Immunoglobulins Pending 08/31/2019  SPEP Pending 08/31/2019  G6PD No results found for: G6PDH TPMT No results found for: TPMT   Chest-xray:  Pending 08/31/2019  Contraception: hysterectomy  Alcohol use: Occasional  Assessment/Plan:   Patient was counseled on the purpose, proper use, and adverse effects of methotrexate including nausea, infection, and signs and symptoms of pneumonitis. Discussed that there is the possibility of an increased risk of malignancy, specifically lymphomas, but it is not well understood if this increased risk is due to the medication or the disease state.  Instructed patient that medication should be held for infection and prior to surgery.  Advised patient to avoid live vaccines. Recommend annual influenza, Pneumovax 23, Prevnar 13, and Shingrix as indicated.   Reviewed instructions with patient to take methotrexate weekly along with folic acid daily.  Discussed the importance of frequent monitoring of kidney and liver function and blood counts, and provided patient with standing lab instructions.  Counseled patient to avoid NSAIDs and alcohol while on methotrexate.  Provided patient with educational materials on methotrexate and answered all questions.   Patient voiced understanding.  Patient consented to methotrexate use.  Will upload into chart.    Dose of methotrexate will be 15 mg every 7 days for 2 weeks then increase to 20 mg every 7 days along with folic acid 2 mg daily. Prescription pending  chest x-ray and labs.  We will submit a PA for methotrexate pen device.  Patient counseled on vial and syringe in the event that pen device not approved.  Educated patient on how to use a vial and syringe and reviewed injection technique with patient.  Patient was able to demonstrate proper technique for injections using vial and syringe.  Provided patient educational material regarding injection technique and storage of methotrexate.    If patient is uncomfortable injecting she can schedule another appointment with pharmacy to review.  All questions encouraged and answered.  Instructed patient to call with any further questions or concerns.  Verlin Fester, PharmD, [place], CPP Clinical Specialty Pharmacist [phone]  08/31/2019 9:00 AM

## 2019-08-31 NOTE — Patient Instructions (Signed)
Standing Labs We placed an order today for your standing lab work.    Please come back and get your standing labs in 2 weeks, 4 weeks, 8 weeks, then every 3 months.  We have open lab daily Monday through Thursday from 8:30-12:30 PM and 1:30-4:30 PM and Friday from 8:30-12:30 PM and 1:30-4:00 PM at the office of Dr. Shaili Deveshwar.   You may experience shorter wait times on Monday and Friday afternoons. The office is located at [address], [address], [place], [place] [address] No appointment is necessary.   Labs are drawn by [facility].  You may receive a bill from [facility] for your lab work.  If you wish to have your labs drawn at another location, please call the office 24 hours in advance to send orders.  If you have any questions regarding directions or hours of operation,  please call [phone].   Just as a reminder please drink plenty of water prior to coming for your lab work. Thanks!    

## 2019-08-31 NOTE — Progress Notes (Signed)
I called patient to discuss lab results with her.  Her LFTs are elevated.  Her voicemail box is full.  Please try calling again later to see if she is taking any NSAIDs.  She should stop taking all the NSAIDs.  Rest of the labs are pending.

## 2019-09-01 NOTE — Progress Notes (Signed)
CXR is normal

## 2019-09-03 NOTE — Progress Notes (Signed)
Patient was to start methotrexate.  Do we need to hold until follow up visit or discuss alternative treatment options?

## 2019-09-04 LAB — COMPLETE METABOLIC PANEL WITH GFR
AG Ratio: 2 (calc) (ref 1.0–2.5)
ALT: 72 U/L — ABNORMAL HIGH (ref 6–29)
AST: 32 U/L — ABNORMAL HIGH (ref 10–30)
Albumin: 4.4 g/dL (ref 3.6–5.1)
Alkaline phosphatase (APISO): 127 U/L — ABNORMAL HIGH (ref 31–125)
BUN: 14 mg/dL (ref 7–25)
CO2: 31 mmol/L (ref 20–32)
Calcium: 9.7 mg/dL (ref 8.6–10.2)
Chloride: 103 mmol/L (ref 98–110)
Creat: 0.65 mg/dL (ref 0.50–1.10)
GFR, Est African American: 130 mL/min/{1.73_m2} (ref >=60)
GFR, Est Non African American: 112 mL/min/{1.73_m2} (ref >=60)
Globulin: 2.2 g/dL (calc) (ref 1.9–3.7)
Glucose, Bld: 95 mg/dL (ref 65–99)
Potassium: 4.2 mmol/L (ref 3.5–5.3)
Sodium: 139 mmol/L (ref 135–146)
Total Bilirubin: 0.5 mg/dL (ref 0.2–1.2)
Total Protein: 6.6 g/dL (ref 6.1–8.1)

## 2019-09-04 LAB — URINALYSIS, ROUTINE W REFLEX MICROSCOPIC
Bilirubin Urine: NEGATIVE
Glucose, UA: NEGATIVE
Hgb urine dipstick: NEGATIVE
Ketones, ur: NEGATIVE
Leukocytes,Ua: NEGATIVE
Nitrite: NEGATIVE
Protein, ur: NEGATIVE
Specific Gravity, Urine: 1.023 (ref 1.001–1.03)
pH: 6.5 (ref 5.0–8.0)

## 2019-09-04 LAB — IGG, IGA, IGM
IgG (Immunoglobin G), Serum: 557 mg/dL — ABNORMAL LOW (ref 600–1640)
IgM, Serum: 76 mg/dL (ref 50–300)
Immunoglobulin A: 107 mg/dL (ref 47–310)

## 2019-09-04 LAB — CBC WITH DIFFERENTIAL/PLATELET
Absolute Monocytes: 795 cells/uL (ref 200–950)
Basophils Absolute: 80 cells/uL (ref 0–200)
Basophils Relative: 1.5 %
Eosinophils Absolute: 148 cells/uL (ref 15–500)
Eosinophils Relative: 2.8 %
HCT: 43.9 % (ref 35.0–45.0)
Hemoglobin: 14.5 g/dL (ref 11.7–15.5)
Lymphs Abs: 943 cells/uL (ref 850–3900)
MCH: 33.6 pg — ABNORMAL HIGH (ref 27.0–33.0)
MCHC: 33 g/dL (ref 32.0–36.0)
MCV: 101.6 fL — ABNORMAL HIGH (ref 80.0–100.0)
MPV: 11 fL (ref 7.5–12.5)
Monocytes Relative: 15 %
Neutro Abs: 3334 cells/uL (ref 1500–7800)
Neutrophils Relative %: 62.9 %
Platelets: 343 10*3/uL (ref 140–400)
RBC: 4.32 10*6/uL (ref 3.80–5.10)
RDW: 12.9 % (ref 11.0–15.0)
Total Lymphocyte: 17.8 %
WBC: 5.3 10*3/uL (ref 3.8–10.8)

## 2019-09-04 LAB — ANTI-NUCLEAR AB-TITER (ANA TITER): ANA Titer 1: 1:40 {titer} — ABNORMAL HIGH

## 2019-09-04 LAB — HEPATITIS C ANTIBODY
Hepatitis C Ab: NONREACTIVE
SIGNAL TO CUT-OFF: 0.01 (ref <1.00)

## 2019-09-04 LAB — PROTEIN ELECTROPHORESIS, SERUM, WITH REFLEX
Albumin ELP: 4.5 g/dL (ref 3.8–4.8)
Alpha 1: 0.4 g/dL — ABNORMAL HIGH (ref 0.2–0.3)
Alpha 2: 0.8 g/dL (ref 0.5–0.9)
Beta 2: 0.4 g/dL (ref 0.2–0.5)
Beta Globulin: 0.5 g/dL (ref 0.4–0.6)
Gamma Globulin: 0.6 g/dL — ABNORMAL LOW (ref 0.8–1.7)
Total Protein: 7.1 g/dL (ref 6.1–8.1)

## 2019-09-04 LAB — HEPATITIS B SURFACE ANTIGEN: Hepatitis B Surface Ag: NONREACTIVE

## 2019-09-04 LAB — 14-3-3 ETA PROTEIN: 14-3-3 eta Protein: <0.2 ng/mL (ref <0.2)

## 2019-09-04 LAB — HEPATITIS B CORE ANTIBODY, IGM: Hep B C IgM: NONREACTIVE

## 2019-09-04 LAB — ANA: Anti Nuclear Antibody (ANA): POSITIVE — AB

## 2019-09-04 LAB — HIV ANTIBODY (ROUTINE TESTING W REFLEX): HIV 1&2 Ab, 4th Generation: NONREACTIVE

## 2019-09-04 LAB — HLA-B27 ANTIGEN: HLA-B27 Antigen: POSITIVE — AB

## 2019-09-04 LAB — SEDIMENTATION RATE: Sed Rate: 6 mm/h (ref 0–20)

## 2019-09-04 LAB — QUANTIFERON-TB GOLD PLUS
Mitogen-NIL: >10 IU/mL
NIL: 0.02 IU/mL
QuantiFERON-TB Gold Plus: NEGATIVE
TB1-NIL: 0.03 IU/mL
TB2-NIL: 0.02 IU/mL

## 2019-09-04 LAB — CYCLIC CITRUL PEPTIDE ANTIBODY, IGG: Cyclic Citrullin Peptide Ab: <16 UNITS

## 2019-09-04 LAB — RHEUMATOID FACTOR: Rheumatoid fact SerPl-aCnc: <14 IU/mL (ref <14)

## 2019-09-06 NOTE — Progress Notes (Signed)
Yes, we can not start her on methotrexate.  We will have to hold off treatment until she returns for the follow-up visit.  We are unable to reach the patient.

## 2019-09-09 ENCOUNTER — Telehealth: Payer: Self-pay | Admitting: Rheumatology

## 2019-09-09 NOTE — Telephone Encounter (Signed)
Patient calling back to let you know since she has been off Celebrex she can tell a difference. She has been off Celebrex since 09/03/2019. Inflammation with knees is worse. Pain is worse. Please call to advise.

## 2019-09-09 NOTE — Telephone Encounter (Signed)
Patient states she has been off of celebrex since 09/03/2019 due to elevated liver functions. Patient states the pain and inflammation has gotten worse the past few days. Patient could tell a difference in pain and swelling when on the combination of prednisone and celebrex. Patient is currently taking 15mg  of prednisone daily. Patient states her knees are swollen, painful and warm to touch. Patient is unable to start methotrexate due to elevated LFTs. Patient would like to know what she can do for pain relief. Please advise.

## 2019-09-09 NOTE — Telephone Encounter (Signed)
I left a message on the answering machine for the patient.  She cannot take any NSAIDs.  We can increase prednisone to 20 mg p.o. daily until she starts on a biologic agent.  She would not be able to take methotrexate.  We should apply for Enbrel or Humira for her treatment.  She is HLA-B27 positive.

## 2019-09-10 ENCOUNTER — Telehealth: Payer: Self-pay | Admitting: Rheumatology

## 2019-09-10 NOTE — Telephone Encounter (Signed)
Patient call stating she is returning your call regarding her medication.

## 2019-09-10 NOTE — Telephone Encounter (Signed)
Attempted to contact patient and left message on machine to advise patient to call the office, see previous telephone encounter.

## 2019-09-10 NOTE — Telephone Encounter (Signed)
Received notification from [employer] regarding a prior authorization for ENBREL. Authorization has been APPROVED from 09/10/19 to 09/09/20.   Authorization # [id number]  Patient must fill through [facility]. Patient has commercial plan and can use a copay card.  Please give patient a sample to take home after new start due to delays with Enbrel Savings card (gets mailed to patient in 2-3 days) and setting up shipment with [facility].  8:50 AM Dorthula Nettles, CPhT

## 2019-09-10 NOTE — Telephone Encounter (Signed)
Advised patient We can increase prednisone to 20 mg p.o. daily until she starts on a biologic agent. Patient is scheduled for 09/14/2019 for a new patient follow up and enbrel new start.

## 2019-09-10 NOTE — Telephone Encounter (Signed)
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  She is unable to take DMARD's due to elevated LFT's.  She has severe symptoms requiring initiation of biologic. Please expedite if possible.   Verlin Fester, PharmD, [place], CPP Clinical Specialty Pharmacist [phone]  09/10/2019 8:24 AM

## 2019-09-10 NOTE — Telephone Encounter (Signed)
Attempted to contact patient and left message on machine to advise patient to call the office.  

## 2019-09-12 NOTE — Progress Notes (Deleted)
Office Visit Note  Patient: Tiffany Swanson             Date of Birth: November 16, 1980           MRN: [id number]             PCP: Janine Limbo, PA-C Referring: Janine Limbo, PA-C Visit Date: 09/14/2019 Occupation: @GUAROCC @  Subjective:  No chief complaint on file.   History of Present Illness: Tiffany Swanson is a 39 y.o. female ***   Activities of Daily Living:  Patient reports morning stiffness for *** {minute/hour:19697}.   Patient {ACTIONS;DENIES/REPORTS:21021675::"Denies"} nocturnal pain.  Difficulty dressing/grooming: {ACTIONS;DENIES/REPORTS:21021675::"Denies"} Difficulty climbing stairs: {ACTIONS;DENIES/REPORTS:21021675::"Denies"} Difficulty getting out of chair: {ACTIONS;DENIES/REPORTS:21021675::"Denies"} Difficulty using hands for taps, buttons, cutlery, and/or writing: {ACTIONS;DENIES/REPORTS:21021675::"Denies"}  No Rheumatology ROS completed.   PMFS History:  There are no problems to display for this patient.   Past Medical History:  Diagnosis Date  . ADD (attention deficit disorder)   . Anxiety   . Depression   . Insomnia   . Shingles     Family History  Problem Relation Age of Onset  . Rheum arthritis Mother   . Liver disease Mother   . Kidney disease Mother   . Diabetes Mother   . COPD Father   . Hypertension Father   . Osteoarthritis Father   . Rheum arthritis Sister   . Osteoarthritis Sister   . Healthy Daughter   . Healthy Son    Past Surgical History:  Procedure Laterality Date  . ABDOMINAL HYSTERECTOMY     2017, 2020  . TONSILLECTOMY     age 63   Social History   Social History Narrative  . Not on file    There is no immunization history on file for this patient.   Objective: Vital Signs: There were no vitals taken for this visit.   Physical Exam   Musculoskeletal Exam: ***  CDAI Exam: CDAI Score: - Patient Global: -; Provider Global: - Swollen: -; Tender: - Joint Exam 09/14/2019   No joint exam has been documented for this  visit   There is currently no information documented on the homunculus. Go to the Rheumatology activity and complete the homunculus joint exam.  Investigation: No additional findings.  Imaging: DG Chest 2 View  Result Date: 08/31/2019 CLINICAL DATA:  Immunosuppressive therapy. EXAM: CHEST - 2 VIEW COMPARISON:  None. FINDINGS: The cardiac silhouette, mediastinal and hilar contours are normal. The lungs are clear. No pleural effusions. No pulmonary lesions. The bony thorax is intact. IMPRESSION: No acute cardiopulmonary findings. Electronically Signed   By: Marijo Sanes M.D.   On: 08/31/2019 20:34   XR Foot 2 Views Left  Result Date: 08/31/2019 Mild first MTP narrowing was noted.  No other MTP involvement, PIP or DIP narrowing was noted.  No intertarsal or tibiotalar joint space narrowing was noted.  No erosive changes were noted. Impression: These findings are consistent with osteoarthritis of the foot.  XR Foot 2 Views Right  Result Date: 08/31/2019 Mild first MTP narrowing was noted.  No other MTP involvement, PIP or DIP narrowing was noted.  No intertarsal tibiotalar joint space narrowing was noted.  No erosive changes were noted. Impression: These findings are consistent with osteoarthritis of the foot.  XR Hand 2 View Left  Result Date: 08/31/2019 Juxta-articular osteopenia was noted.  No MCP, PIP or DIP narrowing was noted.  No intercarpal or radiocarpal joint space narrowing was noted.  No erosive changes were noted. Impression: These findings are  consistent with early rheumatoid arthritis.  XR Hand 2 View Right  Result Date: 08/31/2019 Juxta-articular osteopenia was noted.  No MCP, PIP or DIP narrowing was noted.  No intercarpal or radiocarpal joint space narrowing was noted.  No erosive changes were noted. Impression: These findings are consistent with early rheumatoid arthritis.  XR KNEE 3 VIEW LEFT  Result Date: 08/31/2019 Moderate medial compartment narrowing and mild  patellofemoral narrowing was noted.  No chondrocalcinosis was noted.  Effusion was noted. Impression: These findings are consistent with moderate osteoarthritis and inflammatory arthritis.  XR KNEE 3 VIEW RIGHT  Result Date: 08/31/2019 Moderate medial compartment narrowing and mild patellofemoral narrowing was noted.  No chondrocalcinosis was noted.  Effusion around knee joint was noted. 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   Recent Labs: Lab Results  Component Value Date   WBC 5.3 08/31/2019   HGB 14.5 08/31/2019   PLT 343 08/31/2019   NA 139 08/31/2019   K 4.2 08/31/2019   CL 103 08/31/2019   CO2 31 08/31/2019   GLUCOSE 95 08/31/2019   BUN 14 08/31/2019   CREATININE 0.65 08/31/2019   BILITOT 0.5 08/31/2019   AST 32 (H) 08/31/2019   ALT 72 (H) 08/31/2019   PROT 6.6 08/31/2019   PROT 7.1 08/31/2019   CALCIUM 9.7 08/31/2019   GFRAA 130 08/31/2019   QFTBGOLDPLUS NEGATIVE 08/31/2019  August 31, 2019 UA negative, SPEP normal, TB Gold negative, IgG low at 557, hepatitis B-, hepatitis C negative, HIV negative ANA 1: 40NH, RF negative, anti-CCP negative, 14 3 3  eta negative, ESR 6, HLA-B27 positive  Speciality Comments: No specialty comments available.  Procedures:  No procedures performed Allergies: Penicillins, Sulfa antibiotics, and Ketorolac   Assessment / Plan:     Visit Diagnoses: No diagnosis found.  Orders: No orders of the defined types were placed in this encounter.  No orders of the defined types were placed in this encounter.   Face-to-face time spent with patient was *** minutes. Greater than 50% of time was spent in counseling and coordination of care.  Follow-Up Instructions: No follow-ups on file.   Bo Merino, MD  Note - This record has been created using Editor, commissioning.  Chart creation errors have been sought, but may not always  have been located. Such creation  errors do not reflect on  the standard of medical care.

## 2019-09-14 ENCOUNTER — Ambulatory Visit: Payer: Managed Care, Other (non HMO) | Admitting: Rheumatology

## 2019-09-17 ENCOUNTER — Telehealth: Payer: Self-pay | Admitting: Rheumatology

## 2019-09-17 DIAGNOSIS — M1990 Unspecified osteoarthritis, unspecified site: Secondary | ICD-10-CM

## 2019-09-17 MED ORDER — PREDNISONE 5 MG PO TABS: 20.0000 mg | ORAL_TABLET | Freq: Every day | ORAL | 0 refills | Status: DC

## 2019-09-17 NOTE — Telephone Encounter (Signed)
Last Visit: 08/31/2019 Next Visit: 09/28/2019  Per Dr. Corliss Skains, We can increase prednisone to 20 mg p.o. daily until she starts on a biologic agent. Patient is scheduled to start enbrel on 09/28/2019.   Okay to refill per Dr. Corliss Skains.   Advised patient that refill has been sent.

## 2019-09-17 NOTE — Telephone Encounter (Signed)
Patient requesting a  refill on Prednisone 4 pills a day. Patient has today's dose left. Patient uses [facility] in [place].

## 2019-09-23 NOTE — Progress Notes (Signed)
Office Visit Note  Patient: Tiffany Swanson             Date of Birth: [birth year]           MRN: [id number]             PCP: Janine Limbo, PA-C Referring: Janine Limbo, PA-C Visit Date: 09/28/2019 Occupation: @GUAROCC @  Subjective:  New start visit to discuss Enbrel  History of Present Illness: Tiffany Swanson is a 39 y.o. female with history of chronic inflammatory arthritis and HLA-B27 positive.  She presents today to discuss lab work and to start on Enbrel. She is currently taking prednisone 20 mg po daily.  She discontinued celebrex but has been taking ibuprofen as needed for pain relief. She states the pain is so severe she is currently laid off from work. She is having severe pain in both shoulders, both wrists, both hands, SI joints and both knee joints. She has joint swelling in both hands and both knee joints. She states the fatigue has been significant.   She states her father has a history of crohn's disease, and she has had bloody stools since she was a teenager.  According to the patient she has had 4 colonoscopies in the past.  Her last colonoscopy was in her 63s, which was unremarkable according to the patient.   Activities of Daily Living:  Patient reports morning stiffness for  2 hours.   Patient Reports nocturnal pain.  Difficulty dressing/grooming: Denies Difficulty climbing stairs: Reports Difficulty getting out of chair: Reports Difficulty using hands for taps, buttons, cutlery, and/or writing: Reports  Review of Systems  Constitutional: Positive for fatigue.  HENT: Positive for mouth dryness. Negative for mouth sores and nose dryness.   Eyes: Positive for dryness. Negative for pain and visual disturbance.  Respiratory: Negative for cough, hemoptysis, shortness of breath and difficulty breathing.   Cardiovascular: Negative for chest pain, palpitations, hypertension and swelling in legs/feet.  Gastrointestinal: Positive for blood in stool and diarrhea. Negative for  constipation.  Endocrine: Negative for increased urination.  Genitourinary: Negative for painful urination.  Musculoskeletal: Positive for arthralgias, joint pain, joint swelling and morning stiffness. Negative for myalgias, muscle weakness, muscle tenderness and myalgias.  Skin: Negative for color change, pallor, rash, hair loss, nodules/bumps, skin tightness, ulcers and sensitivity to sunlight.  Allergic/Immunologic: Negative for susceptible to infections.  Neurological: Negative for dizziness, numbness, headaches and weakness.  Hematological: Negative for swollen glands.  Psychiatric/Behavioral: Negative for depressed mood and sleep disturbance. The patient is not nervous/anxious.     PMFS History:  There are no problems to display for this patient.   Past Medical History:  Diagnosis Date  . ADD (attention deficit disorder)   . Anxiety   . Depression   . Insomnia   . Shingles     Family History  Problem Relation Age of Onset  . Rheum arthritis Mother   . Liver disease Mother   . Kidney disease Mother   . Diabetes Mother   . COPD Father   . Hypertension Father   . Osteoarthritis Father   . Rheum arthritis Sister   . Osteoarthritis Sister   . Healthy Daughter   . Healthy Son    Past Surgical History:  Procedure Laterality Date  . ABDOMINAL HYSTERECTOMY     2017, 2020  . TONSILLECTOMY     age 81   Social History   Social History Narrative  . Not on file    There is no immunization  history on file for this patient.   Objective: Vital Signs: BP 105/69 (BP Location: Right Arm, Patient Position: Sitting, Cuff Size: Normal)   Pulse 88   Resp 14   Ht 5' 7"  (1.702 m)   Wt 184 lb 9.6 oz (83.7 kg)   BMI 28.91 kg/m    Physical Exam Vitals and nursing note reviewed.  Constitutional:      Appearance: She is well-developed.  HENT:     Head: Normocephalic and atraumatic.  Eyes:     Conjunctiva/sclera: Conjunctivae normal.  Pulmonary:     Effort: Pulmonary effort  is normal.  Abdominal:     General: Bowel sounds are normal.     Palpations: Abdomen is soft.  Musculoskeletal:     Cervical back: Normal range of motion.  Lymphadenopathy:     Cervical: No cervical adenopathy.  Skin:    General: Skin is warm and dry.     Capillary Refill: Capillary refill takes less than 2 seconds.  Neurological:     Mental Status: She is alert and oriented to person, place, and time.  Psychiatric:        Behavior: Behavior normal.      Musculoskeletal Exam: C-spine, thoracic spine, and lumbar spine good ROM. Midline spinal tenderness in lumbar region.  Tenderness over both SI joints.  Shoulder joints have painful ROM bilaterally.  Elbow joints good ROM with no tenderness or inflammation.  Good ROM of both wrist joints. Tenderness of the left wrist joint.  Synovitis of the right 2nd, 3rd, and 5th MCPs and left 2nd and 3rd MCP joints. She has tenderness of all PIP and DIPs of the right hand.  Painful ROM of both hip joints.  Painful ROM of both knee joints.  No warmth or effusion of knee joints. Tenderness but no inflammation of ankle joints. No tenderness of MTP joints.   CDAI Exam: CDAI Score: -- Patient Global: --; Provider Global: -- Swollen: 5 ; Tender: 26  Joint Exam 09/28/2019      Right  Left  Acromioclavicular   Tender   Tender  Wrist      Tender  MCP 2  Swollen Tender  Swollen Tender  MCP 3  Swollen Tender  Swollen Tender  MCP 4      Tender  MCP 5  Swollen Tender     IP   Tender     PIP 2   Tender     PIP 3   Tender     PIP 4   Tender   Tender  PIP 5   Tender     DIP 2   Tender   Tender  DIP 3   Tender     DIP 4   Tender     DIP 5   Tender     Sacroiliac   Tender   Tender  Knee   Tender   Tender  Ankle   Tender   Tender     Investigation: No additional findings.  Imaging: DG Chest 2 View  Result Date: 08/31/2019 CLINICAL DATA:  Immunosuppressive therapy. EXAM: CHEST - 2 VIEW COMPARISON:  None. FINDINGS: The cardiac silhouette,  mediastinal and hilar contours are normal. The lungs are clear. No pleural effusions. No pulmonary lesions. The bony thorax is intact. IMPRESSION: No acute cardiopulmonary findings. Electronically Signed   By: Marijo Sanes M.D.   On: 08/31/2019 20:34   XR Foot 2 Views Left  Result Date: 08/31/2019 Mild first MTP narrowing was noted.  No  other MTP involvement, PIP or DIP narrowing was noted.  No intertarsal or tibiotalar joint space narrowing was noted.  No erosive changes were noted. Impression: These findings are consistent with osteoarthritis of the foot.  XR Foot 2 Views Right  Result Date: 08/31/2019 Mild first MTP narrowing was noted.  No other MTP involvement, PIP or DIP narrowing was noted.  No intertarsal tibiotalar joint space narrowing was noted.  No erosive changes were noted. Impression: These findings are consistent with osteoarthritis of the foot.  XR Hand 2 View Left  Result Date: 08/31/2019 Juxta-articular osteopenia was noted.  No MCP, PIP or DIP narrowing was noted.  No intercarpal or radiocarpal joint space narrowing was noted.  No erosive changes were noted. Impression: These findings are consistent with early rheumatoid arthritis.  XR Hand 2 View Right  Result Date: 08/31/2019 Juxta-articular osteopenia was noted.  No MCP, PIP or DIP narrowing was noted.  No intercarpal or radiocarpal joint space narrowing was noted.  No erosive changes were noted. Impression: These findings are consistent with early rheumatoid arthritis.  XR KNEE 3 VIEW LEFT  Result Date: 08/31/2019 Moderate medial compartment narrowing and mild patellofemoral narrowing was noted.  No chondrocalcinosis was noted.  Effusion was noted. Impression: These findings are consistent with moderate osteoarthritis and inflammatory arthritis.  XR KNEE 3 VIEW RIGHT  Result Date: 08/31/2019 Moderate medial compartment narrowing and mild patellofemoral narrowing was noted.  No chondrocalcinosis was noted.  Effusion  around knee joint was noted. 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   Recent Labs: Lab Results  Component Value Date   WBC 5.3 08/31/2019   HGB 14.5 08/31/2019   PLT 343 08/31/2019   NA 139 08/31/2019   K 4.2 08/31/2019   CL 103 08/31/2019   CO2 31 08/31/2019   GLUCOSE 95 08/31/2019   BUN 14 08/31/2019   CREATININE 0.65 08/31/2019   BILITOT 0.5 08/31/2019   AST 32 (H) 08/31/2019   ALT 72 (H) 08/31/2019   PROT 6.6 08/31/2019   PROT 7.1 08/31/2019   CALCIUM 9.7 08/31/2019   GFRAA 130 08/31/2019   QFTBGOLDPLUS NEGATIVE 08/31/2019  August 31, 2019 UA negative, ANA 1: 40NH, RF negative, anti-CCP negative, 14 3 3  eta negative, HLA-B27 positive SPEP nonspecific, TB Gold negative, IgG low 557, hepatitis B-, hepatitis C negative, HIV negative 9 Speciality Comments: No specialty comments available.  Procedures:  No procedures performed Allergies: Penicillins, Sulfa antibiotics, and Ketorolac   Assessment / Plan:     Visit Diagnoses: Chronic inflammatory arthritis - HLA-B27 positive: She has tenderness and synovitis of several MCP joints as described above.  She has tenderness over bilateral SI joints, AC joints, left wrist joint, MCPs, PIPs, and DIPs and  both ankle joints.  She has painful range of motion of both knee joints but no warmth or effusion was noted.  She is having severe pain in multiple joints and has had difficulty with ADLs and performing job duties.  She is currently laid off which has been causing increased stressed. She is currently on prednisone 20 mg by mouth daily.  She discontinued Celebrex as recommended but continues to take ibuprofen as needed due to prednisone not controlling her pain and inflammation.  She has been experiencing significant fatigue on a daily basis.  She has morning stiffness lasting several hours and has had severe nocturnal pain.  We reviewed lab work obtained on  08/31/2019.  All questions were addressed.  We  will proceed with starting her on Humira 40 mg subcutaneous injections every 14 days.  Indications, contraindications, potential side effects of Humira were discussed today.  All questions were addressed and consent was obtained.  The first dose of Humira was administered today in the office.  She will continue taking prednisone 20 mg by mouth daily until her follow-up visit. According to the patient she has had frequent loose bloody stools since she was a teenager.  She has a family history of Crohn's disease (father) and has had several colonoscopies in the past which were unremarkable.  She will be establishing care with Dr. Lyndel Safe this week to schedule an urgent updated colonoscopy since she will be starting on Humira today and continues to take prednisone. She will follow-up in the office in 4 weeks.  HLA B27 positive   Counseled patient that Humira is a TNF blocking agent.  Counseled patient on purpose, proper use, and adverse effects of Humira.  Reviewed the most common adverse effects including infections, headache, and injection site reactions. Discussed that there is the possibility of an increased risk of malignancy but it is not well understood if this increased risk is due to the medication or the disease state.  Advised patient to get yearly dermatology exams due to risk of skin cancer. Counseled patient that Humira should be held prior to scheduled surgery.  Counseled patient to avoid live vaccines while on Humira.  Advised patient to get annual influenza vaccine and the pneumococcal vaccine as indicated.    Reviewed the importance of regular labs while on Humira therapy.  Standing orders placed.  Provided patient with medication education material and answered all questions.  Patient consented to Humira.  Will upload consent into the media tab.  Reviewed storage instructions of Humira.  Advised initial injection must be administered in office.   Patient verbalized understanding.  Dose will be for rheumatoid arthritis Humira 40 mg every 14 days.    High risk medication use -CBC and CMP are drawn on 08/31/2019.  AST was 32 and ALT was 72.  She is not a good candidate for methotrexate due to elevated LFTs.  She had baseline immunosuppressive labs on 08/31/2019.  TB Gold was negative on 08/31/2019.  She will be starting on Humira 40 mg subcutaneous injections every 14 days.  Primary osteoarthritis of both feet: She has tenderness in both ankle joints but no synovitis was noted.  No tenderness of MTPs or PIP joints.    Primary osteoarthritis of both knees: She has painful ROM of both knee joints.  No warmth or effusion of knee joints noted.  She has been experiencing severe pain in both knee joints, and she has had difficulty climbing steps, getting up from a chair, and walking due to the discomfort.  She is currently taking prednisone 20 mg daily.  Blood in stool: She has frequent loose bloody stools.  She will be salvaging care with Dr. Lyndel Safe for an urgent colonoscopy.  Her father has Crohn's disease and she has had several colonoscopies in the past which were unremarkable.  Loose stools: According to the patient she has had loose bloody stools intermittently since she was a teenager.  She typically has about 4 stools per day.  She states that she has noticed less blood in her stool since discontinuing Celebrex.  Due to the concern of masking the signs and symptoms of possible IBD, she will be establishing care with Dr. Lyndel Safe this week to schedule an urgent colonoscopy.  She  is currently on prednisone 20 mg daily and will be starting on Humira 40 mg sq injections every 14 days.  She administered a first dose of Humira today in the office.  Other medical conditions are listed as follows:   Family history of rheumatoid arthritis  Dyslipidemia  Anxiety and depression  Other insomnia  Attention deficit disorder, unspecified hyperactivity  presence  History of shingles  Orders: No orders of the defined types were placed in this encounter.  Meds ordered this encounter  Medications  . Adalimumab (HUMIRA PEN) 40 MG/0.4ML PNKT    Sig: Inject 40 mg into the skin every 14 (fourteen) days.    Dispense:  2 each    Refill:  0    Order Specific Question:   Supervising Provider    Answer:   Bo Merino [phone]    Face-to-face time spent with patient was 30 minutes. Greater than 50% of time was spent in counseling and coordination of care.  Follow-Up Instructions: Return in about 4 weeks (around 10/26/2019).   Bo Merino, MD   Scribed by-  Hazel Sams, PA-C  Note - This record has been created using Dragon software.  Chart creation errors have been sought, but may not always  have been located. Such creation errors do not reflect on  the standard of medical care.

## 2019-09-28 ENCOUNTER — Other Ambulatory Visit: Payer: Self-pay

## 2019-09-28 ENCOUNTER — Telehealth: Payer: Self-pay | Admitting: Pharmacist

## 2019-09-28 ENCOUNTER — Encounter: Payer: Self-pay | Admitting: Rheumatology

## 2019-09-28 ENCOUNTER — Ambulatory Visit (INDEPENDENT_AMBULATORY_CARE_PROVIDER_SITE_OTHER): Payer: Managed Care, Other (non HMO) | Admitting: Rheumatology

## 2019-09-28 ENCOUNTER — Ambulatory Visit: Payer: Managed Care, Other (non HMO) | Admitting: Rheumatology

## 2019-09-28 VITALS — BP 105/69 | HR 88 | Resp 14 | Ht 67.0 in | Wt 184.6 lb

## 2019-09-28 DIAGNOSIS — M170 Bilateral primary osteoarthritis of knee: Secondary | ICD-10-CM

## 2019-09-28 DIAGNOSIS — E785 Hyperlipidemia, unspecified: Secondary | ICD-10-CM

## 2019-09-28 DIAGNOSIS — M19071 Primary osteoarthritis, right ankle and foot: Secondary | ICD-10-CM

## 2019-09-28 DIAGNOSIS — Z1589 Genetic susceptibility to other disease: Secondary | ICD-10-CM

## 2019-09-28 DIAGNOSIS — F419 Anxiety disorder, unspecified: Secondary | ICD-10-CM

## 2019-09-28 DIAGNOSIS — G4709 Other insomnia: Secondary | ICD-10-CM

## 2019-09-28 DIAGNOSIS — M19072 Primary osteoarthritis, left ankle and foot: Secondary | ICD-10-CM

## 2019-09-28 DIAGNOSIS — M1990 Unspecified osteoarthritis, unspecified site: Secondary | ICD-10-CM | POA: Diagnosis not present

## 2019-09-28 DIAGNOSIS — K921 Melena: Secondary | ICD-10-CM

## 2019-09-28 DIAGNOSIS — R195 Other fecal abnormalities: Secondary | ICD-10-CM

## 2019-09-28 DIAGNOSIS — F988 Other specified behavioral and emotional disorders with onset usually occurring in childhood and adolescence: Secondary | ICD-10-CM

## 2019-09-28 DIAGNOSIS — Z79899 Other long term (current) drug therapy: Secondary | ICD-10-CM | POA: Diagnosis not present

## 2019-09-28 DIAGNOSIS — F329 Major depressive disorder, single episode, unspecified: Secondary | ICD-10-CM

## 2019-09-28 DIAGNOSIS — F32A Depression, unspecified: Secondary | ICD-10-CM

## 2019-09-28 DIAGNOSIS — Z8619 Personal history of other infectious and parasitic diseases: Secondary | ICD-10-CM

## 2019-09-28 DIAGNOSIS — Z8261 Family history of arthritis: Secondary | ICD-10-CM

## 2019-09-28 MED ORDER — HUMIRA (2 PEN) 40 MG/0.4ML ~~LOC~~ AJKT: 40.0000 mg | AUTO-INJECTOR | SUBCUTANEOUS | 0 refills | Status: DC

## 2019-09-28 NOTE — Telephone Encounter (Signed)
Please start BIV for Humira.   Verlin Fester, PharmD, [place], CPP Clinical Specialty Pharmacist [phone]  09/28/2019 11:17 AM

## 2019-09-28 NOTE — Progress Notes (Signed)
Medication Samples have been provided to the patient.  Drug name: Humira   Strength: 40mg        Qty: 1 LOT Exp.Date: 10/2020  Dosing instructions: Inject 40mg  into the skin every 14 days.   The patient has been instructed regarding the correct time, dose, and frequency of taking this medication, including desired effects and most common side effects.   Laasia Arcos C Tawny Raspberry 11:05 AM 09/28/2019

## 2019-09-28 NOTE — Telephone Encounter (Signed)
Submitted a Prior Authorization request to [employer] for HUMIRA via Cover My Meds. Will update once we receive a response.  Verlin Fester, PharmD, [place], CPP Clinical Specialty Pharmacist [phone]  09/28/2019 3:20 PM

## 2019-09-28 NOTE — Progress Notes (Signed)
Pharmacy Note Subjective: Patient presents today to [facility] for follow up office visit. Patient seen by the pharmacist for counseling on Humira for inflammatory arthritis.  She is naive to therapy.  We discussed starting methotrexate but her LFT's were elevated.  She has been referred to GI for evaluation due to chronic diarrhea and blood in the stool.  Objective:  CBC    Component Value Date/Time   WBC 5.3 08/31/2019 0903   RBC 4.32 08/31/2019 0903   HGB 14.5 08/31/2019 0903   HCT 43.9 08/31/2019 0903   PLT 343 08/31/2019 0903   MCV 101.6 (H) 08/31/2019 0903   MCH 33.6 (H) 08/31/2019 0903   MCHC 33.0 08/31/2019 0903   RDW 12.9 08/31/2019 0903   LYMPHSABS 943 08/31/2019 0903   EOSABS 148 08/31/2019 0903   BASOSABS 80 08/31/2019 0903     CMP     Component Value Date/Time   NA 139 08/31/2019 0903   K 4.2 08/31/2019 0903   CL 103 08/31/2019 0903   CO2 31 08/31/2019 0903   GLUCOSE 95 08/31/2019 0903   BUN 14 08/31/2019 0903   CREATININE 0.65 08/31/2019 0903   CALCIUM 9.7 08/31/2019 0903   PROT 6.6 08/31/2019 0903   PROT 7.1 08/31/2019 0903   AST 32 (H) 08/31/2019 0903   ALT 72 (H) 08/31/2019 0903   BILITOT 0.5 08/31/2019 0903   GFRNONAA 112 08/31/2019 0903   GFRAA 130 08/31/2019 0903      Baseline Immunosuppressant Therapy Labs TB GOLD Quantiferon TB Gold Latest Ref Rng & Units 08/31/2019  Quantiferon TB Gold Plus NEGATIVE NEGATIVE   Hepatitis Panel Hepatitis Latest Ref Rng & Units 08/31/2019  Hep B Surface Ag NON-REACTI NON-REACTIVE  Hep B IgM NON-REACTI NON-REACTIVE  Hep C Ab NON-REACTI NON-REACTIVE  Hep C Ab NON-REACTI NON-REACTIVE   HIV Lab Results  Component Value Date   HIV NON-REACTIVE 08/31/2019   Immunoglobulins Immunoglobulin Electrophoresis Latest Ref Rng & Units 08/31/2019  IgA  47 - 310 mg/dL 765  IgG 465 - 0,354 mg/dL 656(C)  IgM 50 - 127 mg/dL 76   SPEP Serum Protein Electrophoresis Latest Ref Rng & Units 08/31/2019  Total  Protein 6.1 - 8.1 g/dL 7.1  Albumin 3.8 - 4.8 g/dL 4.5  Alpha-1 0.2 - 0.3 g/dL 5.1(Z)  Alpha-2 0.5 - 0.9 g/dL 0.8  Beta Globulin 0.4 - 0.6 g/dL 0.5  Beta 2 0.2 - 0.5 g/dL 0.4  Gamma Globulin 0.8 - 1.7 g/dL 0.0(F)   V4BS No results found for: G6PDH TPMT No results found for: TPMT   Chest x-ray: no acute cardiopulmonary findings 08/31/2019  Does patient have diagnosis of heart failure?  No  Assessment/Plan:  Counseled patient that Humira is a TNF blocking agent.  Counseled patient on purpose, proper use, and adverse effects of Humira.  Reviewed the most common adverse effects including infections, headache, and injection site reactions. Discussed that there is the possibility of an increased risk of malignancy but it is not well understood if this increased risk is due to the medication or the disease state.  Advised patient to get yearly dermatology exams due to risk of skin cancer. Counseled patient that Humira should be held prior to scheduled surgery.  Counseled patient to avoid live vaccines while on Humira.  Advised patient to get annual influenza vaccine and the pneumococcal vaccine as indicated.    Reviewed the importance of regular labs while on Humira therapy.  Standing orders placed.  Provided patient with medication education material  and answered all questions.  Patient consented to Humira.  Will upload consent into the media tab.  Reviewed storage instructions of Humira.  Advised initial injection must be administered in office.  Patient verbalized understanding.  Dose will be Humira 40 mg every 14 days.  Prescription pending lab results and/or insurance approval.  Patient given co-pay card in office in the event of high co-pay.   Demonstrated proper injection technique with Humira demo pen.  Patient able to demonstrate proper injection technique using the teach back method.  Patient self injected in the left upper thigh with:  Sample Medication:  [place]: 1610-9604-54 Lot:  [id number] Expiration: 10/2020  Patient tolerated well.  Observed for 30 mins in office for adverse reaction and none noted. Instructed patient to call with any questions/issues.     Mariella Saa, PharmD, [place], [place] Clinical Specialty Pharmacist [phone]  09/28/2019 10:20 AM

## 2019-09-28 NOTE — Patient Instructions (Signed)
Standing Labs We placed an order today for your standing lab work.    Please come back and get your standing labs in 1 month then every 3 months   We have open lab daily Monday through Thursday from 8:30-12:30 PM and 1:30-4:30 PM and Friday from 8:30-12:30 PM and 1:30-4:00 PM at the office of Dr. Shaili Deveshwar.   You may experience shorter wait times on Monday and Friday afternoons. The office is located at [address], [address], [place], [place] [address] No appointment is necessary.   Labs are drawn by [facility].  You may receive a bill from [facility] for your lab work.  If you wish to have your labs drawn at another location, please call the office 24 hours in advance to send orders.  If you have any questions regarding directions or hours of operation,  please call [phone].   Just as a reminder please drink plenty of water prior to coming for your lab work. Thanks!   

## 2019-09-29 MED ORDER — HUMIRA (2 PEN) 40 MG/0.4ML ~~LOC~~ AJKT: 40.0000 mg | AUTO-INJECTOR | SUBCUTANEOUS | 0 refills | Status: DC

## 2019-09-29 NOTE — Telephone Encounter (Signed)
Called to notify patient of approval but no answer.  Left voicemail stating medication had been approved through insurance and a prescription was sent to specialty pharmacy.  Advised they would reach out in 24 to 48 hours to set up her for shipment and to call the office with any other questions or concerns.   Verlin Fester, PharmD, [place], CPP Clinical Specialty Pharmacist [phone]  09/29/2019 9:25 AM

## 2019-09-29 NOTE — Telephone Encounter (Signed)
Received notification from [employer] regarding a prior authorization for HUMIRA. Authorization has been APPROVED from 09/28/2019 to 09/27/2020.   Authorization # PA Case ID: [id number]  Verlin Fester, PharmD, [place], CPP Clinical Specialty Pharmacist [phone]  09/29/2019 8:10 AM

## 2019-09-30 ENCOUNTER — Ambulatory Visit: Payer: Managed Care, Other (non HMO) | Admitting: Gastroenterology

## 2019-10-01 ENCOUNTER — Ambulatory Visit: Payer: Managed Care, Other (non HMO) | Admitting: Gastroenterology

## 2019-10-01 ENCOUNTER — Telehealth: Payer: Self-pay | Admitting: Rheumatology

## 2019-10-01 DIAGNOSIS — M1990 Unspecified osteoarthritis, unspecified site: Secondary | ICD-10-CM

## 2019-10-01 MED ORDER — PREDNISONE 5 MG PO TABS: 20.0000 mg | ORAL_TABLET | Freq: Every day | ORAL | 0 refills | Status: DC

## 2019-10-01 NOTE — Telephone Encounter (Signed)
Patient states she went to take Prednisone dose, and the lid was not on all the way. Half the bottle dropped down the sink. Patient requests another rx to be called in to [facility] in [place]. Please call to advise.

## 2019-10-01 NOTE — Telephone Encounter (Signed)
Per Dr. Corliss Skains okay to send in new prescription for Prednisone. Patient advised prescription sent to the pharmacy.

## 2019-10-14 NOTE — Progress Notes (Deleted)
Office Visit Note  Patient: Tiffany Swanson             Date of Birth: 02/05/1981           MRN: [id number]             PCP: Eunice Blase, PA-C Referring: Eunice Blase, PA-C Visit Date: 10/28/2019 Occupation: @GUAROCC @  Subjective:  No chief complaint on file.   History of Present Illness: Tiffany Swanson is a 39 y.o. female ***   Activities of Daily Living:  Patient reports morning stiffness for *** {minute/hour:19697}.   Patient {ACTIONS;DENIES/REPORTS:21021675::"Denies"} nocturnal pain.  Difficulty dressing/grooming: {ACTIONS;DENIES/REPORTS:21021675::"Denies"} Difficulty climbing stairs: {ACTIONS;DENIES/REPORTS:21021675::"Denies"} Difficulty getting out of chair: {ACTIONS;DENIES/REPORTS:21021675::"Denies"} Difficulty using hands for taps, buttons, cutlery, and/or writing: {ACTIONS;DENIES/REPORTS:21021675::"Denies"}  No Rheumatology ROS completed.   PMFS History:  There are no problems to display for this patient.   Past Medical History:  Diagnosis Date  . ADD (attention deficit disorder)   . Anxiety   . Depression   . Insomnia   . Shingles     Family History  Problem Relation Age of Onset  . Rheum arthritis Mother   . Liver disease Mother   . Kidney disease Mother   . Diabetes Mother   . COPD Father   . Hypertension Father   . Osteoarthritis Father   . Rheum arthritis Sister   . Osteoarthritis Sister   . Healthy Daughter   . Healthy Son    Past Surgical History:  Procedure Laterality Date  . ABDOMINAL HYSTERECTOMY     2017, 2020  . TONSILLECTOMY     age 86   Social History   Social History Narrative  . Not on file    There is no immunization history on file for this patient.   Objective: Vital Signs: There were no vitals taken for this visit.   Physical Exam   Musculoskeletal Exam: ***  CDAI Exam: CDAI Score: -- Patient Global: --; Provider Global: -- Swollen: --; Tender: -- Joint Exam 10/28/2019   No joint exam has been documented for  this visit   There is currently no information documented on the homunculus. Go to the Rheumatology activity and complete the homunculus joint exam.  Investigation: No additional findings.  Imaging: No results found.  Recent Labs: Lab Results  Component Value Date   WBC 5.3 08/31/2019   HGB 14.5 08/31/2019   PLT 343 08/31/2019   NA 139 08/31/2019   K 4.2 08/31/2019   CL 103 08/31/2019   CO2 31 08/31/2019   GLUCOSE 95 08/31/2019   BUN 14 08/31/2019   CREATININE 0.65 08/31/2019   BILITOT 0.5 08/31/2019   AST 32 (H) 08/31/2019   ALT 72 (H) 08/31/2019   PROT 6.6 08/31/2019   PROT 7.1 08/31/2019   CALCIUM 9.7 08/31/2019   GFRAA 130 08/31/2019   QFTBGOLDPLUS NEGATIVE 08/31/2019    Speciality Comments: No specialty comments available.  Procedures:  No procedures performed Allergies: Penicillins, Sulfa antibiotics, and Ketorolac   Assessment / Plan:     Visit Diagnoses: No diagnosis found.  Orders: No orders of the defined types were placed in this encounter.  No orders of the defined types were placed in this encounter.   Face-to-face time spent with patient was *** minutes. Greater than 50% of time was spent in counseling and coordination of care.  Follow-Up Instructions: No follow-ups on file.   09/02/2019, CMA  Note - This record has been created using Ellen Henri.  Chart creation  errors have been sought, but may not always  have been located. Such creation errors do not reflect on  the standard of medical care.

## 2019-10-19 ENCOUNTER — Telehealth: Payer: Self-pay | Admitting: Rheumatology

## 2019-10-19 NOTE — Progress Notes (Signed)
Office Visit Note  Patient: Tiffany Swanson             Date of Birth: [birth year]           MRN: [id number]             PCP: Janine Limbo, PA-C Referring: Janine Limbo, PA-C Visit Date: 10/20/2019 Occupation: _0 @  Subjective:  Pain in multiple joints   History of Present Illness: Tiffany Swanson is a 39 y.o. female with history of chronic inflammatory arthritis and osteoarthritis.  She was started on Humira 40 mg sq injections once every 14 days on 09/28/19.  She is currently taking prednisone 20 mg po daily and is taking tramadol 50 mg 1 to 2 tablets by mouth every 4 to 6 hours as needed for pain relief.  She states her symptoms improved after starting prednisone for the next few weeks.  She has not noticed any improvement since starting on Huimra. She states that her mother got very sick and is hospitalized in intensive care unit. She is having a flare for the last 2 weeks with increased pain and swelling. She is having severe pain in both hips, both knees, and both feet.  She has ongoing swelling in both hands, both knees, and both ankle joints.  She is having difficulty ambulating due to the discomfort.  She has been experiencing significant nocturnal pain. She has not noticed any improvement with tramadol.   Activities of Daily Living:  Patient reports joint stiffness all day  Patient Reports nocturnal pain.  Difficulty dressing/grooming: Reports Difficulty climbing stairs: Reports Difficulty getting out of chair: Reports Difficulty using hands for taps, buttons, cutlery, and/or writing: Denies  Review of Systems  Constitutional: Positive for fatigue.  HENT: Negative for mouth sores, mouth dryness and nose dryness.   Eyes: Negative for pain, itching, visual disturbance and dryness.  Respiratory: Negative for cough, hemoptysis, shortness of breath and difficulty breathing.   Cardiovascular: Negative for chest pain, palpitations, hypertension and swelling in legs/feet.    Gastrointestinal: Negative for blood in stool, constipation and diarrhea.  Endocrine: Negative for increased urination.  Genitourinary: Negative for difficulty urinating and painful urination.  Musculoskeletal: Positive for arthralgias, joint pain, joint swelling, myalgias, morning stiffness, muscle tenderness and myalgias. Negative for muscle weakness.  Skin: Negative for color change, pallor, rash, hair loss, nodules/bumps, redness, skin tightness, ulcers and sensitivity to sunlight.  Allergic/Immunologic: Negative for susceptible to infections.  Neurological: Negative for dizziness, numbness, headaches and memory loss.  Hematological: Negative for bruising/bleeding tendency and swollen glands.  Psychiatric/Behavioral: Positive for sleep disturbance. Negative for depressed mood and confusion. The patient is not nervous/anxious.     PMFS History:  There are no problems to display for this patient.   Past Medical History:  Diagnosis Date  . ADD (attention deficit disorder)   . Anxiety   . Depression   . Insomnia   . Shingles     Family History  Problem Relation Age of Onset  . Rheum arthritis Mother   . Liver disease Mother   . Kidney disease Mother   . Diabetes Mother   . COPD Father   . Hypertension Father   . Osteoarthritis Father   . Rheum arthritis Sister   . Osteoarthritis Sister   . Healthy Daughter   . Healthy Son    Past Surgical History:  Procedure Laterality Date  . ABDOMINAL HYSTERECTOMY     2017, 2020  . TONSILLECTOMY     age 58  Social History   Social History Narrative  . Not on file    There is no immunization history on file for this patient.   Objective: Vital Signs: BP 97/74 (BP Location: Right Arm, Patient Position: Sitting, Cuff Size: Normal)   Pulse (!) 115   Resp 16   Ht _0  (1.702 m)   Wt 188 lb 12.8 oz (85.6 kg)   BMI 29.57 kg/m    Physical Exam Vitals and nursing note reviewed.  Constitutional:      Appearance: She is  well-developed.  HENT:     Head: Normocephalic and atraumatic.  Eyes:     Conjunctiva/sclera: Conjunctivae normal.  Pulmonary:     Effort: Pulmonary effort is normal.  Abdominal:     General: Bowel sounds are normal.     Palpations: Abdomen is soft.  Musculoskeletal:     Cervical back: Normal range of motion.  Lymphadenopathy:     Cervical: No cervical adenopathy.  Skin:    General: Skin is warm and dry.     Capillary Refill: Capillary refill takes less than 2 seconds.  Neurological:     Mental Status: She is alert and oriented to person, place, and time.  Psychiatric:        Behavior: Behavior normal.      Musculoskeletal Exam: C-spine, thoracic spine, and lumbar spine good ROM.  Shoulder joints have painful ROM bilaterally. Elbow joints have good ROM with no tenderness or inflammation.  Wrist joints good ROM.  Tenderness of all MCP joints.  Synovitis of the right 2nd, 3rd, and 5th MCP joints.  Painful and limited ROM of both hip joints.  Warmth and inflammation and effusion in both knee joints.  Painful ROM of both knees and both ankle joints.  Tenderness and swelling of both ankle joints.  Tenderness of all MTP joints.   CDAI Exam: CDAI Score: 18.6  Patient Global: 8 mm; Provider Global: 8 mm Swollen: 7 ; Tender: 26  Joint Exam 10/20/2019      Right  Left  Glenohumeral   Tender   Tender  MCP 2  Swollen Tender   Tender  MCP 3  Swollen Tender   Tender  MCP 4   Tender   Tender  MCP 5  Swollen Tender   Tender  Hip   Tender   Tender  Knee  Swollen Tender  Swollen Tender  Ankle  Swollen Tender  Swollen Tender  MTP 1   Tender   Tender  MTP 2   Tender   Tender  MTP 3   Tender   Tender  MTP 4   Tender   Tender  MTP 5   Tender   Tender     Investigation: No additional findings.  Imaging: No results found.  Recent Labs: Lab Results  Component Value Date   WBC 5.3 08/31/2019   HGB 14.5 08/31/2019   PLT 343 08/31/2019   NA 139 08/31/2019   K 4.2 08/31/2019   CL  103 08/31/2019   CO2 31 08/31/2019   GLUCOSE 95 08/31/2019   BUN 14 08/31/2019   CREATININE 0.65 08/31/2019   BILITOT 0.5 08/31/2019   AST 32 (H) 08/31/2019   ALT 72 (H) 08/31/2019   PROT 6.6 08/31/2019   PROT 7.1 08/31/2019   CALCIUM 9.7 08/31/2019   GFRAA 130 08/31/2019   QFTBGOLDPLUS NEGATIVE 08/31/2019    Speciality Comments: No specialty comments available.  Procedures:  No procedures performed Allergies: Penicillins, Sulfa antibiotics, and Ketorolac  She  has tenderness and inflammation of multiple joints as described above.  She is having severe pain in both shoulders, both hands, both hips, both knees, both ankle joints, and both feet. She has painful ROM and warmth of both knee joints and both ankle joints.  She has had difficulty ambulating due to the severity of her pain.  She has had significant difficulty with ADLs and severe nocturnal pain.  She has been taking tramadol 50 mg 2 tablets every 4 hours for pain relief, which has not been alleviating her pain.  She is taking prednisone 20 mg daily but continues to have persistent pain and inflammation.  She was started on Humira 40 mg sq injections at her last office visit on 09/28/19.  She has had 2 Humira injections and has not noticed any improvement.  She is tolerating Humira without any side effects or injection site reactions.    Assessment / Plan:     Visit Diagnoses: Chronic inflammatory arthritis - HLA-B27 positive.  Patient has been on prednisone 20 mg p.o. daily since her last visit.  She states she was doing better initially but she has been under a lot of stress.  Her mother was in intensive care unit.  She could not get her colonoscopy.  She is having a flare in the last 1 week.  She is having severe pain in her hip joints knee joints and ankles.  She had effusion and swelling in her bilateral knee joints today.  She also had swelling of her bilateral ankle joints today.  She has been taking Humira every other week  without any good response so far.  I have advised her to schedule an appointment with gastroenterologist as soon as possible.  I will give her Depo-Medrol 80 mg intramuscularly today.  I will also increase her prednisone to 40 mg p.o. daily for 1 week and then taper to 30 mg p.o. daily for 1 week, prednisone 25 mg daily for 1 week and she will go back to 20 mg p.o. daily.  Side effects of high-dose prednisone were discussed.  Dietary modifications were discussed.  I will give her Humira samples (4 pen) so she can dose Humira weekly for 2 months.  We will check labs today and then in 1 month again.  HLA B27 positive  High risk medication use - Humira 40 mg every 14 days, prednisone 21m daily.  She will get CBC and CMP today.  Elevated LFTs-I am uncertain about the etiology of elevated LFTs.  Patient was taking NSAIDs in the past.  We will check LFTs today.  Primary osteoarthritis of both feet-she had synovitis over bilateral ankle joints today.  Primary osteoarthritis of both knees-she has underlying osteoarthritis but has severe swelling and effusion in her knee joints today.  Blood in stool-patient states she has not had any blood in her stool since she started prednisone.  Loose stools-diarrhea resolved.  Family history of rheumatoid arthritis  Anxiety and depression  Other insomnia  Dyslipidemia  History of shingles  Attention deficit disorder, unspecified hyperactivity presence  Orders: Orders Placed This Encounter  Procedures  . CBC with Differential/Platelet  . COMPLETE METABOLIC PANEL WITH GFR   Meds ordered this encounter  Medications  . methylPREDNISolone acetate (DEPO-MEDROL) injection 80 mg  . predniSONE (DELTASONE) 10 MG tablet    Sig: Take 4 tabs po qd x7 days, take 3 tabs po qd x7 days, take 2.5 tabs po qd x 7 days, then take 2 tabs po qd.  Dispense:  88 tablet    Refill:  0    Face-to-face time spent with patient was 40 minutes. Greater than 50% of time  was spent in counseling and coordination of care.  Follow-Up Instructions: Return in 4 weeks (on 11/17/2019) for Chronic inflammatory arthritis .   Bo Merino, MD  Note - This record has been created using Editor, commissioning.  Chart creation errors have been sought, but may not always  have been located. Such creation errors do not reflect on  the standard of medical care.

## 2019-10-19 NOTE — Telephone Encounter (Signed)
Patient calling because the last three days she has been unable to move due to the excruciating pain. Patients pain in her hips/ knees, from waist down. Patient has been taking Humira as prescribed. Patient has been trying Ibuprofen, also a Tramadol Rx from PCP that does not touch it. Please call to advise.

## 2019-10-19 NOTE — Telephone Encounter (Signed)
Attempted to contact the patient and left message for patient to call the office.  

## 2019-10-19 NOTE — Telephone Encounter (Signed)
Patient states she has had two Humira injections. Patient states she is on Prednisone 20 mg daily.  Patient states she has had the worse pain she had. Patient states the pain is from her hips down. Patient states her hips and her knees are the worse. Patient states she was given a prescription for Tramadol for her PCP over the weekend as she was in so much pain she was unable to walk. Patient states the Tramadol did not touch the pain. Patient states she has also tried Ibuprofen with no relief.  Patient was offered an appointment for evaluation this afternoon but declined as she is on her way home from [place]. Patient has been scheduled for an appointment on 10/20/2019 at 9:40 am.

## 2019-10-19 NOTE — Telephone Encounter (Signed)
We will further evaluate at her office visit tomorrow on 10/20/19.

## 2019-10-20 ENCOUNTER — Ambulatory Visit (INDEPENDENT_AMBULATORY_CARE_PROVIDER_SITE_OTHER): Payer: Managed Care, Other (non HMO) | Admitting: Rheumatology

## 2019-10-20 ENCOUNTER — Other Ambulatory Visit: Payer: Self-pay

## 2019-10-20 ENCOUNTER — Encounter: Payer: Self-pay | Admitting: Physician Assistant

## 2019-10-20 VITALS — BP 97/74 | HR 115 | Resp 16 | Ht 67.0 in | Wt 188.8 lb

## 2019-10-20 DIAGNOSIS — M19072 Primary osteoarthritis, left ankle and foot: Secondary | ICD-10-CM

## 2019-10-20 DIAGNOSIS — E785 Hyperlipidemia, unspecified: Secondary | ICD-10-CM

## 2019-10-20 DIAGNOSIS — Z8261 Family history of arthritis: Secondary | ICD-10-CM

## 2019-10-20 DIAGNOSIS — Z8619 Personal history of other infectious and parasitic diseases: Secondary | ICD-10-CM

## 2019-10-20 DIAGNOSIS — M1990 Unspecified osteoarthritis, unspecified site: Secondary | ICD-10-CM

## 2019-10-20 DIAGNOSIS — Z79899 Other long term (current) drug therapy: Secondary | ICD-10-CM | POA: Diagnosis not present

## 2019-10-20 DIAGNOSIS — F419 Anxiety disorder, unspecified: Secondary | ICD-10-CM

## 2019-10-20 DIAGNOSIS — M170 Bilateral primary osteoarthritis of knee: Secondary | ICD-10-CM

## 2019-10-20 DIAGNOSIS — F988 Other specified behavioral and emotional disorders with onset usually occurring in childhood and adolescence: Secondary | ICD-10-CM

## 2019-10-20 DIAGNOSIS — M19071 Primary osteoarthritis, right ankle and foot: Secondary | ICD-10-CM | POA: Diagnosis not present

## 2019-10-20 DIAGNOSIS — F32A Depression, unspecified: Secondary | ICD-10-CM

## 2019-10-20 DIAGNOSIS — K921 Melena: Secondary | ICD-10-CM

## 2019-10-20 DIAGNOSIS — R195 Other fecal abnormalities: Secondary | ICD-10-CM

## 2019-10-20 DIAGNOSIS — Z1589 Genetic susceptibility to other disease: Secondary | ICD-10-CM | POA: Diagnosis not present

## 2019-10-20 DIAGNOSIS — G4709 Other insomnia: Secondary | ICD-10-CM

## 2019-10-20 DIAGNOSIS — F329 Major depressive disorder, single episode, unspecified: Secondary | ICD-10-CM

## 2019-10-20 LAB — CBC WITH DIFFERENTIAL/PLATELET
Absolute Monocytes: 1103 cells/uL — ABNORMAL HIGH (ref 200–950)
Basophils Absolute: 41 cells/uL (ref 0–200)
Basophils Relative: 0.7 %
Eosinophils Absolute: 142 cells/uL (ref 15–500)
Eosinophils Relative: 2.4 %
HCT: 38.8 % (ref 35.0–45.0)
Hemoglobin: 13 g/dL (ref 11.7–15.5)
Lymphs Abs: 1687 cells/uL (ref 850–3900)
MCH: 33.9 pg — ABNORMAL HIGH (ref 27.0–33.0)
MCHC: 33.5 g/dL (ref 32.0–36.0)
MCV: 101 fL — ABNORMAL HIGH (ref 80.0–100.0)
MPV: 10.5 fL (ref 7.5–12.5)
Monocytes Relative: 18.7 %
Neutro Abs: 2926 cells/uL (ref 1500–7800)
Neutrophils Relative %: 49.6 %
Platelets: 280 10*3/uL (ref 140–400)
RBC: 3.84 10*6/uL (ref 3.80–5.10)
RDW: 12.6 % (ref 11.0–15.0)
Total Lymphocyte: 28.6 %
WBC: 5.9 10*3/uL (ref 3.8–10.8)

## 2019-10-20 LAB — COMPLETE METABOLIC PANEL WITH GFR
AG Ratio: 2 (calc) (ref 1.0–2.5)
ALT: 37 U/L — ABNORMAL HIGH (ref 6–29)
AST: 22 U/L (ref 10–30)
Albumin: 3.8 g/dL (ref 3.6–5.1)
Alkaline phosphatase (APISO): 136 U/L — ABNORMAL HIGH (ref 31–125)
BUN: 11 mg/dL (ref 7–25)
CO2: 28 mmol/L (ref 20–32)
Calcium: 8.9 mg/dL (ref 8.6–10.2)
Chloride: 104 mmol/L (ref 98–110)
Creat: 0.61 mg/dL (ref 0.50–1.10)
GFR, Est African American: 132 mL/min/{1.73_m2} (ref >=60)
GFR, Est Non African American: 114 mL/min/{1.73_m2} (ref >=60)
Globulin: 1.9 g/dL (calc) (ref 1.9–3.7)
Glucose, Bld: 90 mg/dL (ref 65–99)
Potassium: 4.4 mmol/L (ref 3.5–5.3)
Sodium: 139 mmol/L (ref 135–146)
Total Bilirubin: 0.4 mg/dL (ref 0.2–1.2)
Total Protein: 5.7 g/dL — ABNORMAL LOW (ref 6.1–8.1)

## 2019-10-20 MED ORDER — METHYLPREDNISOLONE ACETATE 40 MG/ML IJ SUSP
80.0000 mg | Freq: Once | INTRAMUSCULAR | Status: AC
  Administered 2019-10-20: 80 mg via INTRAMUSCULAR

## 2019-10-20 MED ORDER — PREDNISONE 10 MG PO TABS: ORAL_TABLET | ORAL | 0 refills | Status: DC

## 2019-10-20 NOTE — Progress Notes (Signed)
Medication Samples have been provided to the patient.  Drug name: Humira Strength: 40mg      Qty: 4 LOT Exp.Date: 11/2020  Dosing instructions: Inject 40mg  into the skin once weekly for 2 months.   The patient has been instructed regarding the correct time, dose, and frequency of taking this medication, including desired effects and most common side effects.   12/2020 Feras Gardella 11:17 AM 10/20/2019

## 2019-10-20 NOTE — Progress Notes (Signed)
Patient in for an appointment. Patient given an injection for Depo Medrol while in office and tolerated well.   Administrations This Visit    methylPREDNISolone acetate (DEPO-MEDROL) injection 80 mg    Admin Date 10/20/2019 Action Given Dose 80 mg Route Intramuscular Administered By Henriette Combs, LPN

## 2019-10-20 NOTE — Patient Instructions (Signed)
Please take prednisone 40 mg daily for 1 week, prednisone 30 mg daily for 1 week, and is on 25 mg daily for 1 week and then 20 mg daily until follow-up visit.   Standing Labs We placed an order today for your standing lab work.    Please come back and get your standing labs in 1 month and then every 3 months.  We have open lab daily Monday through Thursday from 8:30-12:30 PM and 1:30-4:30 PM and Friday from 8:30-12:30 PM and 1:30-4:00 PM at the office of Dr. Pollyann Savoy.   You may experience shorter wait times on Monday and Friday afternoons. The office is located at [address], [address], [place], [place] [address] No appointment is necessary.   Labs are drawn by [facility].  You may receive a bill from [facility] for your lab work.  If you wish to have your labs drawn at another location, please call the office 24 hours in advance to send orders.  If you have any questions regarding directions or hours of operation,  please call [phone].   Just as a reminder please drink plenty of water prior to coming for your lab work. Thanks!

## 2019-10-21 NOTE — Progress Notes (Signed)
LFTs are better.  CBC is within normal limits.  Patient should continue to avoid all NSAIDs.  The plan is to add methotrexate at the follow-up visit.

## 2019-10-24 ENCOUNTER — Other Ambulatory Visit: Payer: Self-pay | Admitting: Rheumatology

## 2019-10-24 DIAGNOSIS — M1990 Unspecified osteoarthritis, unspecified site: Secondary | ICD-10-CM

## 2019-10-28 ENCOUNTER — Ambulatory Visit: Payer: Managed Care, Other (non HMO) | Admitting: Rheumatology

## 2019-10-28 ENCOUNTER — Ambulatory Visit: Payer: Managed Care, Other (non HMO) | Admitting: Physician Assistant

## 2019-11-03 NOTE — Progress Notes (Signed)
Office Visit Note  Patient: Tiffany Swanson             Date of Birth: [birth year]           MRN: [id number]             PCP: Janine Limbo, PA-C Referring: Janine Limbo, PA-C Visit Date: 11/17/2019 Occupation: @GUAROCC @  Subjective:  Other (joint pain/swelling/stiffness )   History of Present Illness: Tiffany Swanson is a 39 y.o. female with history of chronic inflammatory arthritis and HLA-B27.  She was given a Depo-Medrol injection 80 mg last visit due to severe inflammatory arthritis.  She states after the Depo-Medrol shot she has been on prednisone 40 mg a day for 3 weeks and then she reduced it to 20 mg a day.  She has noticed some improvement in the swelling.  She currently has discomfort in her both shoulders, both hips and her both knees.  She describes a burning sensation from inside out in all of her joints.  She has been on Humira weekly dose for the last 3 weeks.  Activities of Daily Living:  Patient reports morning stiffness for 24  hours.   Patient Reports nocturnal pain.  Difficulty dressing/grooming: Reports Difficulty climbing stairs: Reports Difficulty getting out of chair: Reports Difficulty using hands for taps, buttons, cutlery, and/or writing: Denies  Review of Systems  Constitutional: Positive for fatigue. Negative for night sweats, weight gain and weight loss.  HENT: Positive for mouth dryness and nose dryness. Negative for mouth sores, trouble swallowing and trouble swallowing.   Eyes: Positive for dryness. Negative for pain, redness and visual disturbance.  Respiratory: Negative for cough, shortness of breath and difficulty breathing.   Cardiovascular: Negative for chest pain, palpitations, hypertension, irregular heartbeat and swelling in legs/feet.  Gastrointestinal: Negative for blood in stool, constipation and diarrhea.  Endocrine: Negative for increased urination.  Genitourinary: Negative for difficulty urinating and vaginal dryness.  Musculoskeletal:  Positive for arthralgias, joint pain, joint swelling and morning stiffness. Negative for myalgias, muscle weakness, muscle tenderness and myalgias.  Skin: Negative for color change, rash, hair loss, redness, skin tightness, ulcers and sensitivity to sunlight.  Allergic/Immunologic: Negative for susceptible to infections.  Neurological: Positive for weakness. Negative for dizziness, numbness, headaches, memory loss and night sweats.  Hematological: Positive for bruising/bleeding tendency. Negative for swollen glands.  Psychiatric/Behavioral: Negative for depressed mood, confusion and sleep disturbance. The patient is not nervous/anxious.     PMFS History:  Patient Active Problem List   Diagnosis Date Noted  . Chronic inflammatory arthritis 11/17/2019  . HLA B27 positive 11/17/2019  . High risk medication use 11/17/2019  . Primary osteoarthritis of both feet 11/17/2019  . Primary osteoarthritis of both knees 11/17/2019  . Anxiety and depression 11/17/2019  . Dyslipidemia 11/17/2019  . Attention deficit disorder 11/17/2019  . History of shingles 11/17/2019  . Other insomnia 11/17/2019    Past Medical History:  Diagnosis Date  . ADD (attention deficit disorder)   . Anxiety   . Depression   . Insomnia   . Shingles     Family History  Problem Relation Age of Onset  . Rheum arthritis Mother   . Liver disease Mother   . Kidney disease Mother   . Diabetes Mother   . COPD Father   . Hypertension Father   . Osteoarthritis Father   . Rheum arthritis Sister   . Osteoarthritis Sister   . Healthy Daughter   . Healthy Son    Past  Surgical History:  Procedure Laterality Date  . ABDOMINAL HYSTERECTOMY     2017, 2020  . TONSILLECTOMY     age 26   Social History   Social History Narrative  . Not on file    There is no immunization history on file for this patient.   Objective: Vital Signs: BP 109/75 (BP Location: Right Arm, Patient Position: Sitting, Cuff Size: Normal)    Pulse (!) 109   Resp 15   Ht 5' 7"  (1.702 m)   Wt 184 lb 12.8 oz (83.8 kg)   BMI 28.94 kg/m    Physical Exam Vitals and nursing note reviewed.  Constitutional:      Appearance: She is well-developed.  HENT:     Head: Normocephalic and atraumatic.  Eyes:     Conjunctiva/sclera: Conjunctivae normal.  Cardiovascular:     Rate and Rhythm: Normal rate and regular rhythm.     Heart sounds: Normal heart sounds.  Pulmonary:     Effort: Pulmonary effort is normal.     Breath sounds: Normal breath sounds.  Abdominal:     General: Bowel sounds are normal.     Palpations: Abdomen is soft.  Musculoskeletal:     Cervical back: Normal range of motion.  Lymphadenopathy:     Cervical: No cervical adenopathy.  Skin:    General: Skin is warm and dry.     Capillary Refill: Capillary refill takes less than 2 seconds.  Neurological:     Mental Status: She is alert and oriented to person, place, and time.  Psychiatric:        Behavior: Behavior normal.      Musculoskeletal Exam: C-spine thoracic and lumbar spine were in good range of motion.  She had discomfort range of motion of her shoulder joints.  Elbow joints wrist joint MCPs PIPs DIPs with good range of motion with no synovitis.  Hip joints knee joints ankles MTPs PIPs with good range of motion with no synovitis.  She had discomfort range of motion of her left knee joint.  CDAI Exam: CDAI Score: 4.9  Patient Global: 5 mm; Provider Global: 4 mm Swollen: 0 ; Tender: 6  Joint Exam 11/17/2019      Right  Left  Glenohumeral   Tender   Tender  Hip   Tender   Tender  Knee   Tender   Tender     Investigation: No additional findings.  Imaging: No results found.  Recent Labs: Lab Results  Component Value Date   WBC 5.9 10/20/2019   HGB 13.0 10/20/2019   PLT 280 10/20/2019   NA 139 10/20/2019   K 4.4 10/20/2019   CL 104 10/20/2019   CO2 28 10/20/2019   GLUCOSE 90 10/20/2019   BUN 11 10/20/2019   CREATININE 0.61 10/20/2019     BILITOT 0.4 10/20/2019   AST 22 10/20/2019   ALT 37 (H) 10/20/2019   PROT 5.7 (L) 10/20/2019   CALCIUM 8.9 10/20/2019   GFRAA 132 10/20/2019   QFTBGOLDPLUS NEGATIVE 08/31/2019    Speciality Comments: No specialty comments available.  Procedures:  No procedures performed Allergies: Penicillins, Sulfa antibiotics, and Ketorolac   Assessment / Plan:     Visit Diagnoses: Chronic inflammatory arthritis - HLA-B27 positive.  Patient states she is a still a lot of discomfort.  I did not see any synovitis on examination.  She still continues to have arthralgias.  She had been on prednisone 40 mg p.o. daily which she recently reduced to 20 mg  p.o. daily.  She is also on Humira 40 mg subcu weekly for 2 weeks.  Then she will reduce it to every other week.  The plan is to taper prednisone by 5 mg every 2 weeks which I discussed with patient at length.  I also discussed adding another medication.  After discussing different treatment options and their side effects she is willing to proceed with leflunomide.  Indications side effects contraindications were discussed.  A handout was given and consent was taken.  The plan is to start on leflunomide 20 mg p.o. daily once labs are available and if she has a flare on tapering her medications.  She will come in 10 days for her CBC and CMP.  After that level be checked in 1 month and then every 3 months to monitor for drug toxicity.  Medication counseling:   Baseline Immunosuppressant Therapy Labs  Quantiferon TB Gold Latest Ref Rng & Units 08/31/2019  Quantiferon TB Gold Plus NEGATIVE NEGATIVE    Hepatitis Latest Ref Rng & Units 08/31/2019  Hep B Surface Ag NON-REACTI NON-REACTIVE  Hep B IgM NON-REACTI NON-REACTIVE  Hep C Ab NON-REACTI NON-REACTIVE  Hep C Ab NON-REACTI NON-REACTIVE    Lab Results  Component Value Date   HIV NON-REACTIVE 08/31/2019    Immunoglobulin Electrophoresis Latest Ref Rng & Units 08/31/2019  IgA  47 - 310 mg/dL 107  IgG 600  - 1,640 mg/dL 557(L)  IgM 50 - 300 mg/dL 76    Serum Protein Electrophoresis Latest Ref Rng & Units 10/20/2019  Total Protein 6.1 - 8.1 g/dL 5.7(L)  Albumin 3.8 - 4.8 g/dL -  Alpha-1 0.2 - 0.3 g/dL -  Alpha-2 0.5 - 0.9 g/dL -  Beta Globulin 0.4 - 0.6 g/dL -  Beta 2 0.2 - 0.5 g/dL -  Gamma Globulin 0.8 - 1.7 g/dL -      Patient was counseled on the purpose, proper use, and adverse effects of leflunomide including risk of infection, nausea/diarrhea/weight loss, increase in blood pressure, rash, hair loss, tingling in the hands and feet, and signs and symptoms of interstitial lung disease.   Also counseled on Black Box warning of liver injury and importance of avoiding alcohol while on therapy. Discussed that there is the possibility of an increased risk of malignancy but it is not well understood if this increased risk is due to the medication or the disease state.  Counseled patient to avoid live vaccines. Recommend annual influenza, Pneumovax 23, Prevnar 13, and Shingrix as indicated.   Discussed the importance of frequent monitoring of liver function and blood count.  Standing orders placed.  Patient had hysterectomy.  Provided patient with educational materials on leflunomide and answered all questions.  Patient consented to [place] use, and consent will be uploaded into the media tab.   Patient dose will be 20 mg p.o. daily.  Prescription pending lab results and/or insurance approval.  HLA B27 positive  High risk medication use - Humira 40 mg every 7 days for 2 months, prednisone 71m daily.   Primary osteoarthritis of both feet-no synovitis was noted.  Primary osteoarthritis of both knees-she has discomfort in her knee joints but no synovitis was noted.  Blood in stool - Improved since she has been on Humira and prednisone.  She has an appointment with the gastroenterologist on November 29, 2019.  She could not go for earlier appointment as her mother was very sick and she passed  away.  Loose stools - diarrhea resolved.  Family history of  rheumatoid arthritis - mother  Other insomnia  Anxiety and depression  Dyslipidemia  Attention deficit disorder, unspecified hyperactivity presence  History of shingles  Orders: No orders of the defined types were placed in this encounter.  No orders of the defined types were placed in this encounter.    Follow-Up Instructions: Return in about 6 weeks (around 12/29/2019) for Chronic inflammatory arthritis.   Bo Merino, MD  Note - This record has been created using Editor, commissioning.  Chart creation errors have been sought, but may not always  have been located. Such creation errors do not reflect on  the standard of medical care.

## 2019-11-09 ENCOUNTER — Other Ambulatory Visit: Payer: Self-pay | Admitting: Rheumatology

## 2019-11-09 DIAGNOSIS — M1990 Unspecified osteoarthritis, unspecified site: Secondary | ICD-10-CM

## 2019-11-09 NOTE — Telephone Encounter (Signed)
Last Visit: 10/20/2019 Next Visit: 11/17/2019  Current Dose per office note on 10/20/2019: prednisone to 40 mg p.o. daily for 1 week and then taper to 30 mg p.o. daily for 1 week, prednisone 25 mg daily for 1 week and she will go back to 20 mg p.o. daily.  Okay to refill prednisone?

## 2019-11-13 ENCOUNTER — Other Ambulatory Visit: Payer: Self-pay | Admitting: Rheumatology

## 2019-11-17 ENCOUNTER — Ambulatory Visit (INDEPENDENT_AMBULATORY_CARE_PROVIDER_SITE_OTHER): Payer: Managed Care, Other (non HMO) | Admitting: Rheumatology

## 2019-11-17 ENCOUNTER — Other Ambulatory Visit: Payer: Self-pay

## 2019-11-17 ENCOUNTER — Encounter: Payer: Self-pay | Admitting: Rheumatology

## 2019-11-17 ENCOUNTER — Telehealth: Payer: Self-pay | Admitting: *Deleted

## 2019-11-17 VITALS — BP 109/75 | HR 109 | Resp 15 | Ht 67.0 in | Wt 184.8 lb

## 2019-11-17 DIAGNOSIS — M1990 Unspecified osteoarthritis, unspecified site: Secondary | ICD-10-CM

## 2019-11-17 DIAGNOSIS — E785 Hyperlipidemia, unspecified: Secondary | ICD-10-CM

## 2019-11-17 DIAGNOSIS — M19071 Primary osteoarthritis, right ankle and foot: Secondary | ICD-10-CM

## 2019-11-17 DIAGNOSIS — Z79899 Other long term (current) drug therapy: Secondary | ICD-10-CM

## 2019-11-17 DIAGNOSIS — F419 Anxiety disorder, unspecified: Secondary | ICD-10-CM

## 2019-11-17 DIAGNOSIS — Z8261 Family history of arthritis: Secondary | ICD-10-CM

## 2019-11-17 DIAGNOSIS — Z1589 Genetic susceptibility to other disease: Secondary | ICD-10-CM

## 2019-11-17 DIAGNOSIS — F329 Major depressive disorder, single episode, unspecified: Secondary | ICD-10-CM

## 2019-11-17 DIAGNOSIS — R195 Other fecal abnormalities: Secondary | ICD-10-CM

## 2019-11-17 DIAGNOSIS — Z8619 Personal history of other infectious and parasitic diseases: Secondary | ICD-10-CM

## 2019-11-17 DIAGNOSIS — K921 Melena: Secondary | ICD-10-CM

## 2019-11-17 DIAGNOSIS — M19072 Primary osteoarthritis, left ankle and foot: Secondary | ICD-10-CM

## 2019-11-17 DIAGNOSIS — M170 Bilateral primary osteoarthritis of knee: Secondary | ICD-10-CM | POA: Insufficient documentation

## 2019-11-17 DIAGNOSIS — G4709 Other insomnia: Secondary | ICD-10-CM

## 2019-11-17 DIAGNOSIS — F988 Other specified behavioral and emotional disorders with onset usually occurring in childhood and adolescence: Secondary | ICD-10-CM

## 2019-11-17 DIAGNOSIS — F32A Depression, unspecified: Secondary | ICD-10-CM | POA: Insufficient documentation

## 2019-11-17 NOTE — Patient Instructions (Addendum)
Taper prednisone by 5 mg every 2 weeks   Standing Labs We placed an order today for your standing lab work.   Please have your standing labs drawn in 10 days off 1 month and then every 3 months.  If possible, please have your labs drawn 2 weeks prior to your appointment so that the provider can discuss your results at your appointment.  We have open lab daily Monday through Thursday from 8:30-12:30 PM and 1:30-4:30 PM and Friday from 8:30-12:30 PM and 1:30-4:00 PM at the office of Dr. Pollyann Savoy, [facility].   You may experience shorter wait times on Monday and Friday afternoons. The office is located at [address], [address], Labs are drawn by [employer].  You may receive a bill from [facility] for your lab work.  If you wish to have your labs drawn at another location, please call the office 24 hours in advance to send orders.  If you have any questions regarding directions or hours of operation,  please call [phone].   As a reminder, please drink plenty of water prior to coming for your lab work. Thanks!   Leflunomide tablets What is this medicine? LEFLUNOMIDE (le FLOO na mide) is for rheumatoid arthritis. This medicine may be used for other purposes; ask your health care provider or pharmacist if you have questions. COMMON BRAND NAME(S): Arava What should I tell my health care provider before I take this medicine? They need to know if you have any of these conditions:  diabetes  have a fever or infection  high blood pressure  immune system problems  kidney disease  liver disease  low blood cell counts, like low white cell, platelet, or red cell counts  lung or breathing disease, like asthma  recently received or scheduled to receive a vaccine  receiving treatment for cancer  skin conditions or sensitivity  tingling of the fingers or toes, or other nerve disorder  tuberculosis  an unusual or allergic reaction to leflunomide,  teriflunomide, other medicines, food, dyes, or preservatives  pregnant or trying to get pregnant  breast-feeding How should I use this medicine? Take this medicine by mouth with a full glass of water. Follow the directions on the prescription label. Take your medicine at regular intervals. Do not take your medicine more often than directed. Do not stop taking except on your doctor's advice. Talk to your pediatrician regarding the use of this medicine in children. Special care may be needed. Overdosage: If you think you have taken too much of this medicine contact a poison control center or emergency room at once. NOTE: This medicine is only for you. Do not share this medicine with others. What if I miss a dose? If you miss a dose, take it as soon as you can. If it is almost time for your next dose, take only that dose. Do not take double or extra doses. What may interact with this medicine? Do not take this medicine with any of the following medications:  teriflunomide This medicine may also interact with the following medications:  alosetron  birth control pills  caffeine  cefaclor  certain medicines for diabetes like nateglinide, repaglinide, rosiglitazone, pioglitazone  certain medicines for high cholesterol like atorvastatin, pravastatin, rosuvastatin, simvastatin  charcoal  cholestyramine  ciprofloxacin  duloxetine  furosemide  ketoprofen  live virus vaccines  medicines that increase your risk for infection  methotrexate  mitoxantrone  paclitaxel  penicillin  theophylline  tizanidine  warfarin This list may not  describe all possible interactions. Give your health care provider a list of all the medicines, herbs, non-prescription drugs, or dietary supplements you use. Also tell them if you smoke, drink alcohol, or use illegal drugs. Some items may interact with your medicine. What should I watch for while using this medicine? Visit your health care  provider for regular checks on your progress. Tell your doctor or health care provider if your symptoms do not start to get better or if they get worse. You may need blood work done while you are taking this medicine. This medicine may cause serious skin reactions. They can happen weeks to months after starting the medicine. Contact your health care provider right away if you notice fevers or flu-like symptoms with a rash. The rash may be red or purple and then turn into blisters or peeling of the skin. Or, you might notice a red rash with swelling of the face, lips or lymph nodes in your neck or under your arms. This medicine may stay in your body for up to 2 years after your last dose. Tell your doctor about any unusual side effects or symptoms. A medicine can be given to help lower your blood levels of this medicine more quickly. Women must use effective birth control with this medicine. There is a potential for serious side effects to an unborn child. Do not become pregnant while taking this medicine. Inform your doctor if you wish to become pregnant. This medicine remains in your blood after you stop taking it. You must continue using effective birth control until the blood levels have been checked and they are low enough. A medicine can be given to help lower your blood levels of this medicine more quickly. Immediately talk to your doctor if you think you may be pregnant. You may need a pregnancy test. Talk to your health care provider or pharmacist for more information. You should not receive certain vaccines during your treatment and for a certain time after your treatment with this medication ends. Talk to your health care provider for more information. What side effects may I notice from receiving this medicine? Side effects that you should report to your doctor or health care professional as soon as possible:  allergic reactions like skin rash, itching or hives, swelling of the face, lips, or  tongue  breathing problems  cough  increased blood pressure  low blood counts - this medicine may decrease the number of white blood cells and platelets. You may be at increased risk for infections and bleeding.  pain, tingling, numbness in the hands or feet  rash, fever, and swollen lymph nodes  redness, blistering, peeing or loosening of the skin, including inside the mouth  signs of decreased platelets or bleeding - bruising, pinpoint red spots on the skin, black, tarry stools, blood in urine  signs of infection - fever or chills, cough, sore throat, pain or trouble passing urine  signs and symptoms of liver injury like dark yellow or brown urine; general ill feeling or flu-like symptoms; light-colored stools; loss of appetite; nausea; right upper belly pain; unusually weak or tired; yellowing of the eyes or skin  trouble passing urine or change in the amount of urine  vomiting Side effects that usually do not require medical attention (report to your doctor or health care professional if they continue or are bothersome):  diarrhea  hair thinning or loss  headache  nausea  tiredness This list may not describe all possible side effects. Call your  doctor for medical advice about side effects. You may report side effects to FDA at 1-800-FDA-1088. Where should I keep my medicine? Keep out of the reach of children. Store at room temperature between 15 and 30 degrees C (59 and 86 degrees F). Protect from moisture and light. Throw away any unused medicine after the expiration date. NOTE: This sheet is a summary. It may not cover all possible information. If you have questions about this medicine, talk to your doctor, pharmacist, or health care provider.  2020 Elsevier/Gold Standard (2018-08-07 15:06:48) [place], [place] [address] No appointment is necessary.

## 2019-11-17 NOTE — Telephone Encounter (Signed)
Patient consented for Arava. Patient will call office to advise if she wants to begin [place].

## 2019-11-30 ENCOUNTER — Other Ambulatory Visit: Payer: Self-pay | Admitting: Rheumatology

## 2019-12-01 ENCOUNTER — Telehealth: Payer: Self-pay | Admitting: Rheumatology

## 2019-12-01 NOTE — Telephone Encounter (Signed)
Patient returned call to the office and left message requesting a return call.  Returned patient's call to inquire about where patient is in her taper schedule. Patient was uncertain as to what mg she has at home and she is currently taking. Patient advised our most recent prescription sent to the pharmacy was for 5 mg tablets. Patient states she will check her prescription when she gets home and call the office to advise where she is at.

## 2019-12-01 NOTE — Telephone Encounter (Signed)
Attempted to contact the patient and left message for patient to call the office. Need to verify where patient is in her taper schedule.

## 2019-12-01 NOTE — Telephone Encounter (Signed)
Patient left a voicemail requesting prescription refill of Prednisone.  Patient states Dr. Corliss Skains told her to contact the office when she needed a refill.

## 2019-12-06 ENCOUNTER — Other Ambulatory Visit: Payer: Self-pay | Admitting: Rheumatology

## 2019-12-06 MED ORDER — PREDNISONE 5 MG PO TABS: ORAL_TABLET | ORAL | 0 refills | Status: DC

## 2019-12-06 NOTE — Telephone Encounter (Signed)
I called patient and discussed current situation.  She states she never did completely taper off prednisone.  She did not start leflunomide as discussed at the last visit.  She is hesitant to add the medication.  She has been taking Humira as monotherapy.  I advised that I will call in prednisone starting at 20 mg and taper by 5 mg every 4 days.  She was in agreement.  We will discuss leflunomide again at the follow-up visit.

## 2019-12-06 NOTE — Telephone Encounter (Signed)
Patient states last refill she received of Prednisone she had to skip about 1 1/2 weeks without medication. (she ran out) Patient is unable to tier down on dose due to pain. Please call to advise.

## 2019-12-06 NOTE — Telephone Encounter (Signed)
Patient states she is on Prednisone 10 mg as she has been tapering her Prednisone. Patient states she was previously on Prednisone 40 mg daily. Patient states she is on Humira.  Patient states she took her Humira injection on  12/02/2019 and it was due on 11/23/2019. Patient states there was a delay in receiving the Humira form the pharmacy.  Patient states she is having increase pain due being late on her Humira and tapering her prednisone. Patient is requesting to return to the Prednisone 40 mg. Please advise.

## 2019-12-24 NOTE — Progress Notes (Deleted)
Office Visit Note  Patient: Tiffany Swanson             Date of Birth: 1980-11-08           MRN: [id number]             PCP: Janine Limbo, PA-C Referring: Janine Limbo, PA-C Visit Date: 01/06/2020 Occupation: _0 @  Subjective:  No chief complaint on file.   History of Present Illness: Tiffany Swanson is a 39 y.o. female ***   Activities of Daily Living:  Patient reports morning stiffness for *** {minute/hour:19697}.   Patient {ACTIONS;DENIES/REPORTS:21021675::"Denies"} nocturnal pain.  Difficulty dressing/grooming: {ACTIONS;DENIES/REPORTS:21021675::"Denies"} Difficulty climbing stairs: {ACTIONS;DENIES/REPORTS:21021675::"Denies"} Difficulty getting out of chair: {ACTIONS;DENIES/REPORTS:21021675::"Denies"} Difficulty using hands for taps, buttons, cutlery, and/or writing: {ACTIONS;DENIES/REPORTS:21021675::"Denies"}  No Rheumatology ROS completed.   PMFS History:  Patient Active Problem List   Diagnosis Date Noted  . Chronic inflammatory arthritis 11/17/2019  . HLA B27 positive 11/17/2019  . High risk medication use 11/17/2019  . Primary osteoarthritis of both feet 11/17/2019  . Primary osteoarthritis of both knees 11/17/2019  . Anxiety and depression 11/17/2019  . Dyslipidemia 11/17/2019  . Attention deficit disorder 11/17/2019  . History of shingles 11/17/2019  . Other insomnia 11/17/2019    Past Medical History:  Diagnosis Date  . ADD (attention deficit disorder)   . Anxiety   . Depression   . Insomnia   . Shingles     Family History  Problem Relation Age of Onset  . Rheum arthritis Mother   . Liver disease Mother   . Kidney disease Mother   . Diabetes Mother   . COPD Father   . Hypertension Father   . Osteoarthritis Father   . Rheum arthritis Sister   . Osteoarthritis Sister   . Healthy Daughter   . Healthy Son    Past Surgical History:  Procedure Laterality Date  . ABDOMINAL HYSTERECTOMY     2017, 2020  . TONSILLECTOMY     age 1   Social  History   Social History Narrative  . Not on file    There is no immunization history on file for this patient.   Objective: Vital Signs: There were no vitals taken for this visit.   Physical Exam   Musculoskeletal Exam: ***  CDAI Exam: CDAI Score: -- Patient Global: --; Provider Global: -- Swollen: --; Tender: -- Joint Exam 01/06/2020   No joint exam has been documented for this visit   There is currently no information documented on the homunculus. Go to the Rheumatology activity and complete the homunculus joint exam.  Investigation: No additional findings.  Imaging: No results found.  Recent Labs: Lab Results  Component Value Date   WBC 5.9 10/20/2019   HGB 13.0 10/20/2019   PLT 280 10/20/2019   NA 139 10/20/2019   K 4.4 10/20/2019   CL 104 10/20/2019   CO2 28 10/20/2019   GLUCOSE 90 10/20/2019   BUN 11 10/20/2019   CREATININE 0.61 10/20/2019   BILITOT 0.4 10/20/2019   AST 22 10/20/2019   ALT 37 (H) 10/20/2019   PROT 5.7 (L) 10/20/2019   CALCIUM 8.9 10/20/2019   GFRAA 132 10/20/2019   QFTBGOLDPLUS NEGATIVE 08/31/2019    Speciality Comments: No specialty comments available.  Procedures:  No procedures performed Allergies: Penicillins, Sulfa antibiotics, and Ketorolac   Assessment / Plan:     Visit Diagnoses: No diagnosis found.  Orders: No orders of the defined types were placed in this encounter.  No orders of  the defined types were placed in this encounter.   Face-to-face time spent with patient was *** minutes. Greater than 50% of time was spent in counseling and coordination of care.  Follow-Up Instructions: No follow-ups on file.   Earnestine Mealing, CMA  Note - This record has been created using Editor, commissioning.  Chart creation errors have been sought, but may not always  have been located. Such creation errors do not reflect on  the standard of medical care.

## 2020-01-06 ENCOUNTER — Ambulatory Visit: Payer: Managed Care, Other (non HMO) | Admitting: Rheumatology

## 2020-01-14 ENCOUNTER — Ambulatory Visit: Payer: Managed Care, Other (non HMO) | Admitting: Physician Assistant

## 2020-02-17 ENCOUNTER — Other Ambulatory Visit: Payer: Self-pay | Admitting: Rheumatology

## 2020-03-02 ENCOUNTER — Other Ambulatory Visit: Payer: Self-pay | Admitting: Rheumatology

## 2020-03-07 ENCOUNTER — Other Ambulatory Visit: Payer: Self-pay | Admitting: Rheumatology

## 2021-07-12 NOTE — Progress Notes (Deleted)
? ?Office Visit Note ? ?Patient: Tiffany Swanson             ?Date of Birth: March 12, 1981           ?MRN: [id number]             ?PCP: Janine Limbo, PA-C ?Referring: Jake Bathe * ?Visit Date: 07/26/2021 ?Occupation: _0 @ ? ?Subjective:  ?No chief complaint on file. ? ? ?History of Present Illness: Tiffany Swanson is a 41 y.o. female ***  ? ?Activities of Daily Living:  ?Patient reports morning stiffness for *** {minute/hour:19697}.   ?Patient {ACTIONS;DENIES/REPORTS:21021675::"Denies"} nocturnal pain.  ?Difficulty dressing/grooming: {ACTIONS;DENIES/REPORTS:21021675::"Denies"} ?Difficulty climbing stairs: {ACTIONS;DENIES/REPORTS:21021675::"Denies"} ?Difficulty getting out of chair: {ACTIONS;DENIES/REPORTS:21021675::"Denies"} ?Difficulty using hands for taps, buttons, cutlery, and/or writing: {ACTIONS;DENIES/REPORTS:21021675::"Denies"} ? ?No Rheumatology ROS completed.  ? ?PMFS History:  ?Patient Active Problem List  ? Diagnosis Date Noted  ?? Chronic inflammatory arthritis 11/17/2019  ?? HLA B27 positive 11/17/2019  ?? High risk medication use 11/17/2019  ?? Primary osteoarthritis of both feet 11/17/2019  ?? Primary osteoarthritis of both knees 11/17/2019  ?? Anxiety and depression 11/17/2019  ?? Dyslipidemia 11/17/2019  ?? Attention deficit disorder 11/17/2019  ?? History of shingles 11/17/2019  ?? Other insomnia 11/17/2019  ?  ?Past Medical History:  ?Diagnosis Date  ?? ADD (attention deficit disorder)   ?? Anxiety   ?? Depression   ?? Insomnia   ?? Shingles   ?  ?Family History  ?Problem Relation Age of Onset  ?? Rheum arthritis Mother   ?? Liver disease Mother   ?? Kidney disease Mother   ?? Diabetes Mother   ?? COPD Father   ?? Hypertension Father   ?? Osteoarthritis Father   ?? Rheum arthritis Sister   ?? Osteoarthritis Sister   ?? Healthy Daughter   ?? Healthy Son   ? ?Past Surgical History:  ?Procedure Laterality Date  ?? ABDOMINAL HYSTERECTOMY    ? 2017, 2020  ?? TONSILLECTOMY    ? age 35  ? ?Social  History  ? ?Social History Narrative  ?? Not on file  ? ? ?There is no immunization history on file for this patient.  ? ?Objective: ?Vital Signs: There were no vitals taken for this visit.  ? ?Physical Exam  ? ?Musculoskeletal Exam: *** ? ?CDAI Exam: ?CDAI Score: -- ?Patient Global: --; Provider Global: -- ?Swollen: --; Tender: -- ?Joint Exam 07/26/2021  ? ?No joint exam has been documented for this visit  ? ?There is currently no information documented on the homunculus. Go to the Rheumatology activity and complete the homunculus joint exam. ? ?Investigation: ?No additional findings. ? ?Imaging: ?No results found. ? ?Recent Labs: ?Lab Results  ?Component Value Date  ? WBC 5.9 10/20/2019  ? HGB 13.0 10/20/2019  ? PLT 280 10/20/2019  ? NA 139 10/20/2019  ? K 4.4 10/20/2019  ? CL 104 10/20/2019  ? CO2 28 10/20/2019  ? GLUCOSE 90 10/20/2019  ? BUN 11 10/20/2019  ? CREATININE 0.61 10/20/2019  ? BILITOT 0.4 10/20/2019  ? AST 22 10/20/2019  ? ALT 37 (H) 10/20/2019  ? PROT 5.7 (L) 10/20/2019  ? CALCIUM 8.9 10/20/2019  ? GFRAA 132 10/20/2019  ? QFTBGOLDPLUS NEGATIVE 08/31/2019  ? ? ?Speciality Comments: No specialty comments available. ? ?Procedures:  ?No procedures performed ?Allergies: Penicillins, Sulfa antibiotics, and Ketorolac  ? ?Assessment / Plan:     ?Visit Diagnoses: Chronic inflammatory arthritis - RF-, anti-CCP-, 14-3-3 eta-, ESR 6 ? 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 ?Positive  ANA (antinuclear antibody) - 08/31/19: ANA 1:40NH ? ?High risk medication use ? ?Primary osteoarthritis of both feet ? ?Primary osteoarthritis of both knees ? ?Loose stools ? ?Family history of rheumatoid arthritis ? ?Other insomnia ? ?Anxiety and depression ? ?Dyslipidemia ? ?Attention deficit disorder, unspecified hyperactivity presence ? ?History of shingles ? ?Orders: ?No orders of the defined types were placed in this encounter. ? ?No orders of the defined types were placed in this encounter. ? ? ?Face-to-face time spent with  patient was *** minutes. Greater than 50% of time was spent in counseling and coordination of care. ? ?Follow-Up Instructions: No follow-ups on file. ? ? ?Ofilia Neas, PA-C ? ?Note - This record has been created using [employer].  ?Chart creation errors have been sought, but may not always  ?have been located. Such creation errors do not reflect on  ?the standard of medical care. ? ?

## 2021-07-26 ENCOUNTER — Ambulatory Visit: Payer: Managed Care, Other (non HMO) | Admitting: Rheumatology

## 2021-07-26 DIAGNOSIS — F988 Other specified behavioral and emotional disorders with onset usually occurring in childhood and adolescence: Secondary | ICD-10-CM

## 2021-07-26 DIAGNOSIS — Z1589 Genetic susceptibility to other disease: Secondary | ICD-10-CM

## 2021-07-26 DIAGNOSIS — M19072 Primary osteoarthritis, left ankle and foot: Secondary | ICD-10-CM

## 2021-07-26 DIAGNOSIS — Z8261 Family history of arthritis: Secondary | ICD-10-CM

## 2021-07-26 DIAGNOSIS — Z79899 Other long term (current) drug therapy: Secondary | ICD-10-CM

## 2021-07-26 DIAGNOSIS — M170 Bilateral primary osteoarthritis of knee: Secondary | ICD-10-CM

## 2021-07-26 DIAGNOSIS — Z8619 Personal history of other infectious and parasitic diseases: Secondary | ICD-10-CM

## 2021-07-26 DIAGNOSIS — R195 Other fecal abnormalities: Secondary | ICD-10-CM

## 2021-07-26 DIAGNOSIS — F32A Depression, unspecified: Secondary | ICD-10-CM

## 2021-07-26 DIAGNOSIS — R768 Other specified abnormal immunological findings in serum: Secondary | ICD-10-CM

## 2021-07-26 DIAGNOSIS — E785 Hyperlipidemia, unspecified: Secondary | ICD-10-CM

## 2021-07-26 DIAGNOSIS — M19071 Primary osteoarthritis, right ankle and foot: Secondary | ICD-10-CM

## 2021-07-26 DIAGNOSIS — F419 Anxiety disorder, unspecified: Secondary | ICD-10-CM

## 2021-07-26 DIAGNOSIS — G4709 Other insomnia: Secondary | ICD-10-CM

## 2021-07-26 DIAGNOSIS — M1990 Unspecified osteoarthritis, unspecified site: Secondary | ICD-10-CM

## 2021-08-12 NOTE — Progress Notes (Deleted)
? ?Office Visit Note ? ?Patient: Tiffany Swanson             ?Date of Birth: [birth year]           ?MRN: [id number]             ?PCP: Janine Limbo, PA-C ?Referring: Janine Limbo, PA-C ?Visit Date: 08/16/2021 ?Occupation: @GUAROCC @ ? ?Subjective:  ?No chief complaint on file. ? ? ?History of Present Illness: Tiffany Swanson is a 41 y.o. female ***history of inflammatory arthritis, HLA-B27 positive.  She returns today after last visit in June 2021.  She was initially seen by me on August 31, 2019.  She had a few prednisone taper by orthopedics prior to that visit.  She presented with severe inflammatory arthritis involving multiple joints and a joint effusion.  She was started on prednisone taper.  Her labs showed elevated LFTs and methotrexate could not be started.  In June she was given intramuscular Depo-Medrol and prednisone taper.  She was also placed on Humira subcu injections.  Leflunomide was also discussed to be added to Humira on November 17, 2019.  Patient lost follow-up after that visit. ? ?Activities of Daily Living:  ?Patient reports morning stiffness for *** {minute/hour:19697}.   ?Patient {ACTIONS;DENIES/REPORTS:21021675::"Denies"} nocturnal pain.  ?Difficulty dressing/grooming: {ACTIONS;DENIES/REPORTS:21021675::"Denies"} ?Difficulty climbing stairs: {ACTIONS;DENIES/REPORTS:21021675::"Denies"} ?Difficulty getting out of chair: {ACTIONS;DENIES/REPORTS:21021675::"Denies"} ?Difficulty using hands for taps, buttons, cutlery, and/or writing: {ACTIONS;DENIES/REPORTS:21021675::"Denies"} ? ?No Rheumatology ROS completed.  ? ?PMFS History:  ?Patient Active Problem List  ? Diagnosis Date Noted  ? Chronic inflammatory arthritis 11/17/2019  ? HLA B27 positive 11/17/2019  ? High risk medication use 11/17/2019  ? Primary osteoarthritis of both feet 11/17/2019  ? Primary osteoarthritis of both knees 11/17/2019  ? Anxiety and depression 11/17/2019  ? Dyslipidemia 11/17/2019  ? Attention deficit disorder 11/17/2019  ? History of  shingles 11/17/2019  ? Other insomnia 11/17/2019  ?  ?Past Medical History:  ?Diagnosis Date  ? ADD (attention deficit disorder)   ? Anxiety   ? Depression   ? Insomnia   ? Shingles   ?  ?Family History  ?Problem Relation Age of Onset  ? Rheum arthritis Mother   ? Liver disease Mother   ? Kidney disease Mother   ? Diabetes Mother   ? COPD Father   ? Hypertension Father   ? Osteoarthritis Father   ? Rheum arthritis Sister   ? Osteoarthritis Sister   ? Healthy Daughter   ? Healthy Son   ? ?Past Surgical History:  ?Procedure Laterality Date  ? ABDOMINAL HYSTERECTOMY    ? 2017, 2020  ? TONSILLECTOMY    ? age 58  ? ?Social History  ? ?Social History Narrative  ? Not on file  ? ? ?There is no immunization history on file for this patient.  ? ?Objective: ?Vital Signs: There were no vitals taken for this visit.  ? ?Physical Exam  ? ?Musculoskeletal Exam: *** ? ?CDAI Exam: ?CDAI Score: -- ?Patient Global: --; Provider Global: -- ?Swollen: --; Tender: -- ?Joint Exam 08/16/2021  ? ?No joint exam has been documented for this visit  ? ?There is currently no information documented on the homunculus. Go to the Rheumatology activity and complete the homunculus joint exam. ? ?Investigation: ?No additional findings. ? ?Imaging: ?No results found. ? ?Recent Labs: ?Lab Results  ?Component Value Date  ? WBC 5.9 10/20/2019  ? HGB 13.0 10/20/2019  ? PLT 280 10/20/2019  ? NA 139 10/20/2019  ? K 4.4 10/20/2019  ?  CL 104 10/20/2019  ? CO2 28 10/20/2019  ? GLUCOSE 90 10/20/2019  ? BUN 11 10/20/2019  ? CREATININE 0.61 10/20/2019  ? BILITOT 0.4 10/20/2019  ? AST 22 10/20/2019  ? ALT 37 (H) 10/20/2019  ? PROT 5.7 (L) 10/20/2019  ? CALCIUM 8.9 10/20/2019  ? GFRAA 132 10/20/2019  ? QFTBGOLDPLUS NEGATIVE 08/31/2019  ? ?August 31, 2019 UA negative, SPEP negative, TB Gold negative, IgG low 557, hepatitis B-, hepatitis C negative, HIV negative, ESR 6, ? ANA 1: 40NH, RF negative, anti-CCP negative, 14 3 3  ETA negative, HLA-B27 positive ? ?Speciality  Comments: No specialty comments available. ? ?Procedures:  ?No procedures performed ?Allergies: Penicillins, Sulfa antibiotics, and Ketorolac  ? ?Assessment / Plan:     ?Visit Diagnoses: No diagnosis found. ? ?Orders: ?No orders of the defined types were placed in this encounter. ? ?No orders of the defined types were placed in this encounter. ? ? ?Face-to-face time spent with patient was *** minutes. Greater than 50% of time was spent in counseling and coordination of care. ? ?Follow-Up Instructions: No follow-ups on file. ? ? ?Bo Merino, MD ? ?Note - This record has been created using [employer].  ?Chart creation errors have been sought, but may not always  ?have been located. Such creation errors do not reflect on  ?the standard of medical care. ?

## 2021-08-16 ENCOUNTER — Ambulatory Visit: Payer: Managed Care, Other (non HMO) | Admitting: Rheumatology

## 2021-08-16 ENCOUNTER — Encounter: Payer: Self-pay | Admitting: *Deleted

## 2021-08-16 DIAGNOSIS — Z1589 Genetic susceptibility to other disease: Secondary | ICD-10-CM

## 2021-08-16 DIAGNOSIS — M1990 Unspecified osteoarthritis, unspecified site: Secondary | ICD-10-CM

## 2021-08-28 ENCOUNTER — Telehealth: Payer: Self-pay | Admitting: *Deleted

## 2021-08-28 NOTE — Telephone Encounter (Signed)
Dismissal letter mailed to patient. Received back in the office stating it was undeliverable to address.  ?

## 2021-10-04 IMAGING — DX DG CHEST 2V
2 series · 2 of 2 positions shown · non-contrast
Comparison: None.

CLINICAL DATA: Immunosuppressive therapy.

EXAM:
CHEST - 2 VIEW

[chest pa]
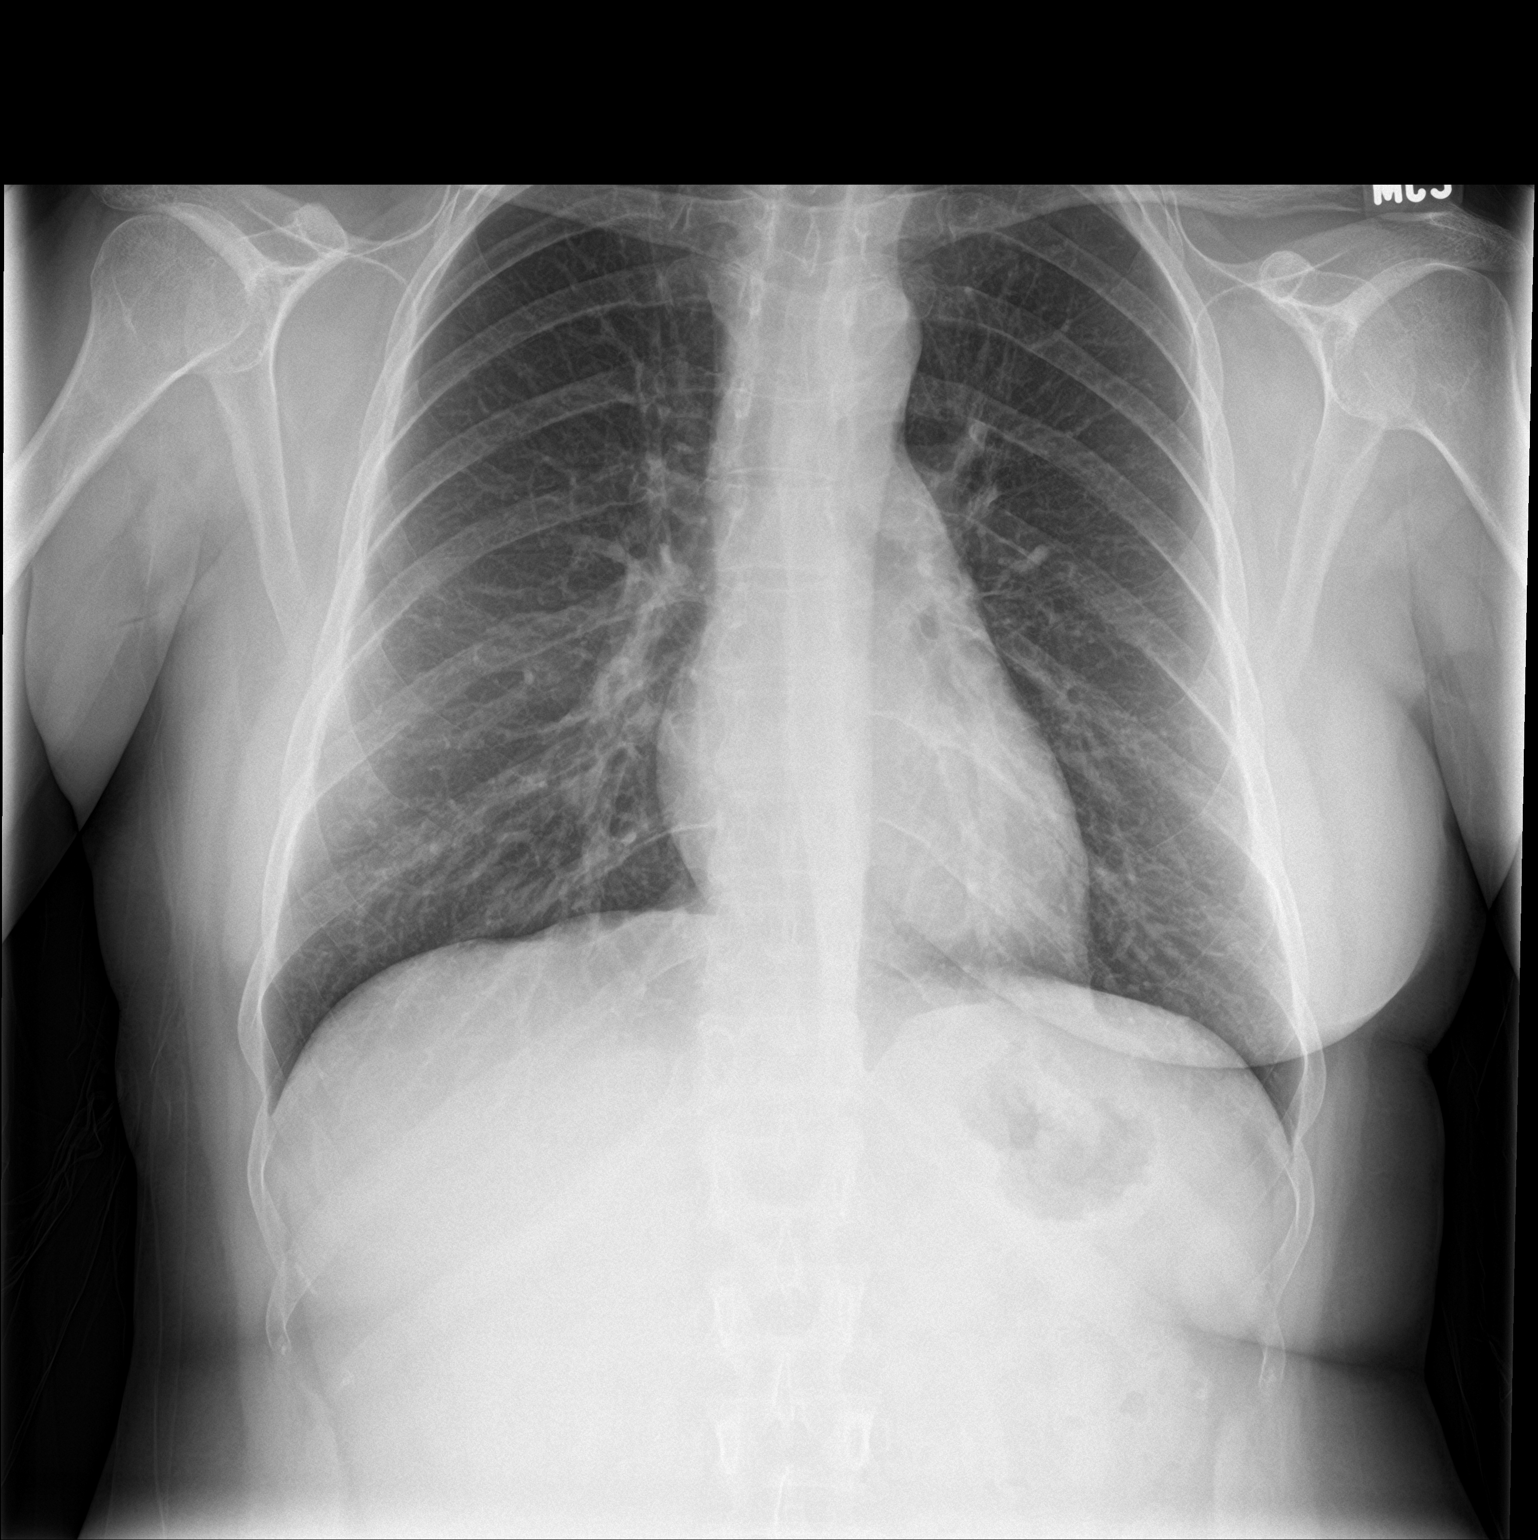

[chest lat]
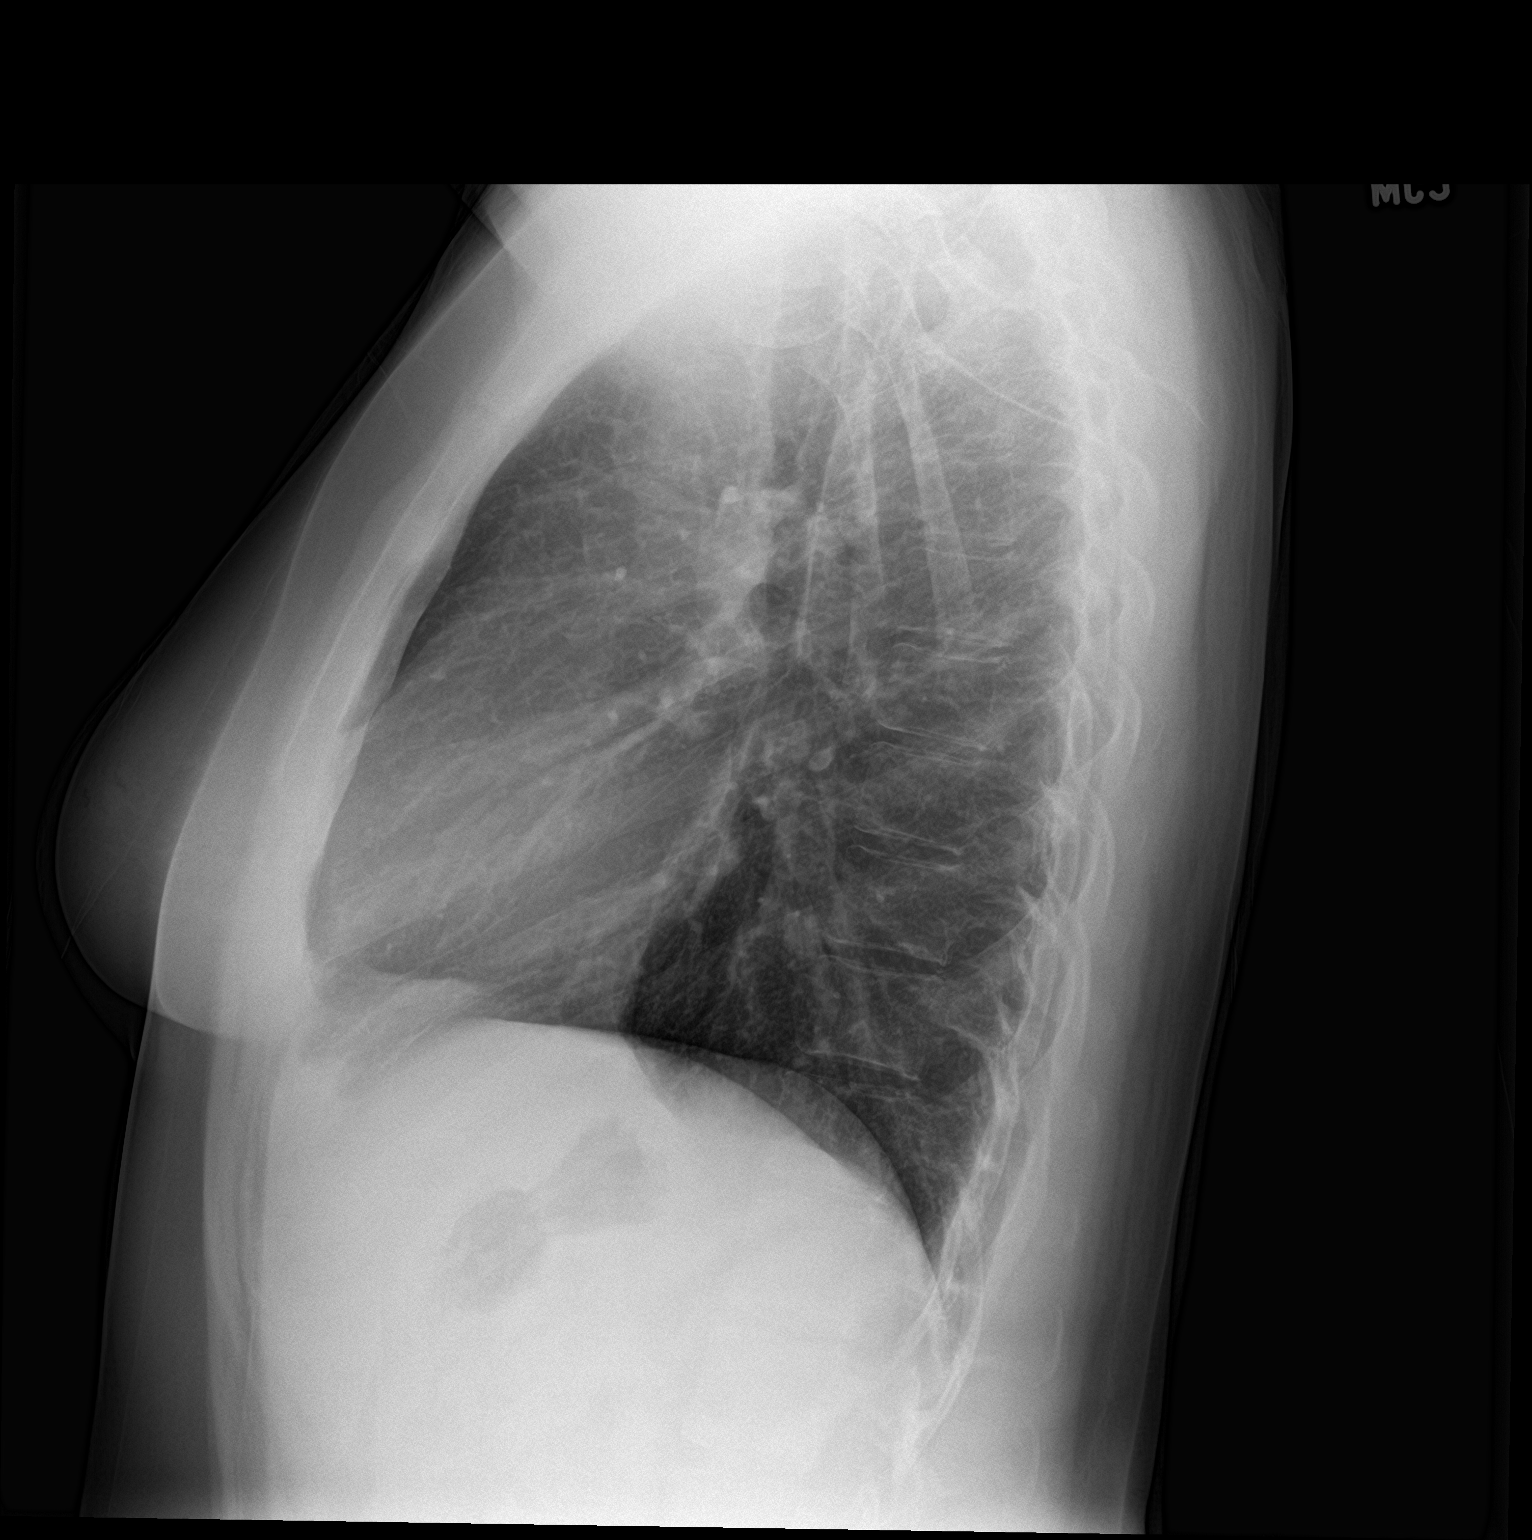

[2 of 2 positions shown; findings below may reference images not displayed]

FINDINGS: The cardiac silhouette, mediastinal and hilar contours are normal.
The lungs are clear. No pleural effusions. No pulmonary lesions. The
bony thorax is intact.
IMPRESSION: No acute cardiopulmonary findings.

## 2021-10-17 ENCOUNTER — Other Ambulatory Visit (HOSPITAL_COMMUNITY)
Admission: EM | Admit: 2021-10-17 | Discharge: 2021-10-19 | Discharge status: Against Medical Advice | Payer: Medicaid Other | Attending: Psychiatry | Admitting: Psychiatry

## 2021-10-17 DIAGNOSIS — F1994 Other psychoactive substance use, unspecified with psychoactive substance-induced mood disorder: Secondary | ICD-10-CM | POA: Diagnosis not present

## 2021-10-17 DIAGNOSIS — Y908 Blood alcohol level of 240 mg/100 ml or more: Secondary | ICD-10-CM | POA: Insufficient documentation

## 2021-10-17 DIAGNOSIS — F339 Major depressive disorder, recurrent, unspecified: Secondary | ICD-10-CM | POA: Diagnosis not present

## 2021-10-17 DIAGNOSIS — F909 Attention-deficit hyperactivity disorder, unspecified type: Secondary | ICD-10-CM | POA: Insufficient documentation

## 2021-10-17 DIAGNOSIS — Z79899 Other long term (current) drug therapy: Secondary | ICD-10-CM | POA: Insufficient documentation

## 2021-10-17 DIAGNOSIS — Z20822 Contact with and (suspected) exposure to covid-19: Secondary | ICD-10-CM | POA: Insufficient documentation

## 2021-10-17 DIAGNOSIS — F10230 Alcohol dependence with withdrawal, uncomplicated: Secondary | ICD-10-CM | POA: Diagnosis not present

## 2021-10-17 DIAGNOSIS — F1120 Opioid dependence, uncomplicated: Secondary | ICD-10-CM | POA: Diagnosis not present

## 2021-10-17 DIAGNOSIS — F1010 Alcohol abuse, uncomplicated: Secondary | ICD-10-CM

## 2021-10-17 DIAGNOSIS — F419 Anxiety disorder, unspecified: Secondary | ICD-10-CM | POA: Insufficient documentation

## 2021-10-17 DIAGNOSIS — F10930 Alcohol use, unspecified with withdrawal, uncomplicated: Secondary | ICD-10-CM

## 2021-10-17 LAB — POCT URINE DRUG SCREEN - MANUAL ENTRY (I-SCREEN)
POC Amphetamine UR: NOT DETECTED
POC Buprenorphine (BUP): NOT DETECTED
POC Cocaine UR: NOT DETECTED
POC Marijuana UR: NOT DETECTED
POC Methadone UR: NOT DETECTED
POC Methamphetamine UR: NOT DETECTED
POC Morphine: POSITIVE — AB
POC Oxazepam (BZO): NOT DETECTED
POC Oxycodone UR: POSITIVE — AB
POC Secobarbital (BAR): NOT DETECTED

## 2021-10-17 LAB — CBC WITH DIFFERENTIAL/PLATELET
Abs Immature Granulocytes: 0.01 10*3/uL (ref 0.00–0.07)
Basophils Absolute: 0.1 10*3/uL (ref 0.0–0.1)
Basophils Relative: 3 %
Eosinophils Absolute: 0.1 10*3/uL (ref 0.0–0.5)
Eosinophils Relative: 2 %
HCT: 45.2 % (ref 36.0–46.0)
Hemoglobin: 15.6 g/dL — ABNORMAL HIGH (ref 12.0–15.0)
Immature Granulocytes: 0 %
Lymphocytes Relative: 43 %
Lymphs Abs: 2.1 10*3/uL (ref 0.7–4.0)
MCH: 34.1 pg — ABNORMAL HIGH (ref 26.0–34.0)
MCHC: 34.5 g/dL (ref 30.0–36.0)
MCV: 98.9 fL (ref 80.0–100.0)
Monocytes Absolute: 0.5 10*3/uL (ref 0.1–1.0)
Monocytes Relative: 11 %
Neutro Abs: 2 10*3/uL (ref 1.7–7.7)
Neutrophils Relative %: 41 %
Platelets: 353 10*3/uL (ref 150–400)
RBC: 4.57 MIL/uL (ref 3.87–5.11)
RDW: 13.2 % (ref 11.5–15.5)
WBC: 4.9 10*3/uL (ref 4.0–10.5)
nRBC: 0 % (ref 0.0–0.2)

## 2021-10-17 LAB — POC SARS CORONAVIRUS 2 AG: SARSCOV2ONAVIRUS 2 AG: NEGATIVE

## 2021-10-17 LAB — HEMOGLOBIN A1C
Hgb A1c MFr Bld: 5.4 % (ref 4.8–5.6)
Mean Plasma Glucose: 108.28 mg/dL

## 2021-10-17 MED ORDER — MAGNESIUM HYDROXIDE 400 MG/5ML PO SUSP: 30.0000 mL | Freq: Every day | ORAL | Status: DC | PRN

## 2021-10-17 MED ORDER — THIAMINE HCL 100 MG/ML IJ SOLN: 100.0000 mg | Freq: Once | INTRAMUSCULAR | Status: DC

## 2021-10-17 MED ORDER — ONDANSETRON 4 MG PO TBDP
4.0000 mg | ORAL_TABLET | Freq: Four times a day (QID) | ORAL | Status: DC | PRN
  Administered 2021-10-18 (×2): 4 mg via ORAL
  Filled 2021-10-17 (×2): qty 1

## 2021-10-17 MED ORDER — NICOTINE 14 MG/24HR TD PT24
14.0000 mg | MEDICATED_PATCH | Freq: Once | TRANSDERMAL | Status: DC
  Filled 2021-10-17: qty 1

## 2021-10-17 MED ORDER — HYDROXYZINE HCL 25 MG PO TABS
25.0000 mg | ORAL_TABLET | Freq: Four times a day (QID) | ORAL | Status: DC | PRN
  Administered 2021-10-18 – 2021-10-19 (×3): 25 mg via ORAL
  Filled 2021-10-17 (×3): qty 1

## 2021-10-17 MED ORDER — THIAMINE HCL 100 MG PO TABS
100.0000 mg | ORAL_TABLET | Freq: Every day | ORAL | Status: DC
  Administered 2021-10-18 – 2021-10-19 (×3): 100 mg via ORAL
  Filled 2021-10-17 (×3): qty 1

## 2021-10-17 MED ORDER — ADULT MULTIVITAMIN W/MINERALS CH
1.0000 | ORAL_TABLET | Freq: Every day | ORAL | Status: DC
  Administered 2021-10-18 – 2021-10-19 (×2): 1 via ORAL
  Filled 2021-10-17 (×2): qty 1

## 2021-10-17 MED ORDER — LORAZEPAM 1 MG PO TABS
1.0000 mg | ORAL_TABLET | Freq: Four times a day (QID) | ORAL | Status: DC | PRN
  Administered 2021-10-18 (×2): 1 mg via ORAL
  Filled 2021-10-17 (×2): qty 1

## 2021-10-17 MED ORDER — ALUM & MAG HYDROXIDE-SIMETH 200-200-20 MG/5ML PO SUSP: 30.0000 mL | ORAL | Status: DC | PRN

## 2021-10-17 MED ORDER — ACETAMINOPHEN 325 MG PO TABS
650.0000 mg | ORAL_TABLET | Freq: Four times a day (QID) | ORAL | Status: DC | PRN
  Administered 2021-10-18: 650 mg via ORAL
  Filled 2021-10-17: qty 2

## 2021-10-17 MED ORDER — LOPERAMIDE HCL 2 MG PO CAPS: 2.0000 mg | ORAL_CAPSULE | ORAL | Status: DC | PRN

## 2021-10-17 NOTE — ED Provider Notes (Signed)
Facility Based Crisis Admission H&P  Date: 10/18/21 Patient Name: Tiffany Swanson MRN: [id number] Chief Complaint:  Chief Complaint  Patient presents with   Addiction Problem   Depression      Diagnoses:  Final diagnoses:  Alcohol abuse  Anxious mood    HPI: Tiffany Swanson, 41 yrs old female present to [facility] for detox, per the patient I need to detox from alcohol.  Patient reports she drinks half of 1/5 of vodka daily, she last drunk hour and a half ago.  Patient is accompanied by her sister-in-law.  Patient does appear intoxicated currently.  Had to be redirected multiple times during the interview.  Patient would ask the same question the NP asked her therefore patient had to be redirected.  Patient can become very teary at times, patient wanted to go outside to smoke, NP advised patient that smoking is not allowed however we can give her a nicotine patch patient agree to taking a nicotine patch.  Patient reports that she does not have a psychiatrist, she sees her PCP who prescribed her Adderall for ADHD and ADD.  Patient takes the following medication hydrocodone 5 over 325 mg twice daily, Adderall 25 mg extended release daily, and Klonopin  0.5 mg daily.    Observation of patient, patient is alert and oriented to person and place, mood is labile, anxious, affect teary at times.  Patient report she last drank vodka 1-1/2 hours ago patient does appear intoxicated.  Very loose conversation.  Patient has to be redirected over and over.  When asked a question patient would ask back the same question to the interviewer.  Ground rules had to be establish so patient understand the importance of these questions.  Patient denies SI, HI, AVH, paranoia.  Patient denies smoking marijuana or any illicit drugs, per the patient she only smokes cigarettes.  NP did discuss and explained to patient, that the detox program may take 3 days and upward depending on what is found in the evaluation.  Patient agreed to  commit to these days.  Recommend inpatient [facility]  PHQ 2-9:   Flowsheet Row ED from 10/17/2021 in [facility]  C-SSRS RISK CATEGORY Low Risk        Total Time spent with patient: 20 minutes  Musculoskeletal  Strength & Muscle Tone: within normal limits Gait & Station: normal Patient leans: N/A  Psychiatric Specialty Exam  Presentation General Appearance: Bizarre  Eye Contact:Fair  Speech:Blocked  Speech Volume:Decreased  Handedness:Ambidextrous   Mood and Affect  Mood:Anxious  Affect:Non-Congruent   Thought Process  Thought Processes:Disorganized  Descriptions of Associations:Loose  Orientation:Partial  Thought Content:WDL  Diagnosis of Schizophrenia or Schizoaffective disorder in past: No   Hallucinations:Hallucinations: None  Ideas of Reference:None  Suicidal Thoughts:Suicidal Thoughts: No  Homicidal Thoughts:Homicidal Thoughts: No   Sensorium  Memory:Immediate Fair  Judgment:Poor  Insight:Poor   Executive Functions  Concentration:Poor  Attention Span:Poor  Recall:Fair  Fund of Knowledge:Fair  Language:Fair   Psychomotor Activity  Psychomotor Activity:Psychomotor Activity: Normal   Assets  Assets:Desire for Improvement   Sleep  Sleep:Sleep: Poor   Nutritional Assessment (For OBS and FBC admissions only) Has the patient had a weight loss or gain of 10 pounds or more in the last 3 months?: No Has the patient had a decrease in food intake/or appetite?: No Does the patient have dental problems?: No Does the patient have eating habits or behaviors that may be indicators of an eating disorder including binging or inducing vomiting?: No  Has the patient recently lost weight without trying?: 0 Has the patient been eating poorly because of a decreased appetite?: 0 Malnutrition Screening Tool Score: 0    Physical Exam HENT:     Head: Normocephalic.     Nose: Nose normal.  Cardiovascular:     Rate  and Rhythm: Tachycardia present. Rhythm irregular.  Pulmonary:     Effort: Pulmonary effort is normal.  Musculoskeletal:        General: Normal range of motion.     Cervical back: Normal range of motion.  Skin:    General: Skin is warm.  Neurological:     General: No focal deficit present.     Mental Status: She is alert.  Psychiatric:        Mood and Affect: Mood normal.        Behavior: Behavior normal.        Thought Content: Thought content normal.        Judgment: Judgment normal.   Review of Systems  Constitutional: Negative.   HENT: Negative.    Eyes: Negative.   Respiratory: Negative.    Cardiovascular: Negative.   Gastrointestinal: Negative.   Genitourinary: Negative.   Musculoskeletal: Negative.   Skin: Negative.   Neurological: Negative.   Endo/Heme/Allergies: Negative.   Psychiatric/Behavioral:  Positive for substance abuse. The patient is nervous/anxious.    Blood pressure (!) 136/94, pulse (!) 101, temperature 98.3 F (36.8 C), temperature source Oral, resp. rate 20, SpO2 97 %. There is no height or weight on file to calculate BMI.  Past Psychiatric History: ADHD, ADD, opioid dependency.   Is the patient at risk to self? no Has the patient been a risk to self in the past 6 months? No .    Has the patient been a risk to self within the distant past? No   Is the patient a risk to others? No   Has the patient been a risk to others in the past 6 months? No   Has the patient been a risk to others within the distant past? No   Past Medical History:  Past Medical History:  Diagnosis Date   ADD (attention deficit disorder)    Anxiety    Depression    Insomnia    Shingles     Past Surgical History:  Procedure Laterality Date   ABDOMINAL HYSTERECTOMY     2017, 2020   TONSILLECTOMY     age 52    Family History:  Family History  Problem Relation Age of Onset   Rheum arthritis Mother    Liver disease Mother    Kidney disease Mother    Diabetes Mother     COPD Father    Hypertension Father    Osteoarthritis Father    Rheum arthritis Sister    Osteoarthritis Sister    Healthy Daughter    Healthy Son     Social History:  Social History   Socioeconomic History   Marital status: Married    Spouse name: Not on file   Number of children: Not on file   Years of education: Not on file   Highest education level: Not on file  Occupational History   Not on file  Tobacco Use   Smoking status: Former    Years: 10.00    Types: Cigarettes    Quit date: 2009    Years since quitting: 14.4   Smokeless tobacco: Never  Vaping Use   Vaping Use: Never used  Substance and Sexual  Activity   Alcohol use: Yes    Comment: occ   Drug use: Never   Sexual activity: Not on file  Other Topics Concern   Not on file  Social History Narrative   Not on file   Social Determinants of Health   Financial Resource Strain: Not on file  Food Insecurity: Not on file  Transportation Needs: Not on file  Physical Activity: Not on file  Stress: Not on file  Social Connections: Not on file  Intimate Partner Violence: Not on file    SDOH:  SDOH Screenings   Alcohol Screen: Not on file  Depression (PHQ2-9): Not on file  Financial Resource Strain: Not on file  Food Insecurity: Not on file  Housing: Not on file  Physical Activity: Not on file  Social Connections: Not on file  Stress: Not on file  Tobacco Use: Not on file  Transportation Needs: Not on file    Last Labs:  Admission on 10/17/2021  Component Date Value Ref Range Status   SARS Coronavirus 2 by RT PCR 10/17/2021 NEGATIVE  NEGATIVE Final   Comment: (NOTE) SARS-CoV-2 target nucleic acids are NOT DETECTED.  The SARS-CoV-2 RNA is generally detectable in upper respiratory specimens during the acute phase of infection. The lowest concentration of SARS-CoV-2 viral copies this assay can detect is 138 copies/mL. A negative result does not preclude SARS-Cov-2 infection and should not be  used as the sole basis for treatment or other patient management decisions. A negative result may occur with  improper specimen collection/handling, submission of specimen other than nasopharyngeal swab, presence of viral mutation(s) within the areas targeted by this assay, and inadequate number of viral copies(<138 copies/mL). A negative result must be combined with clinical observations, patient history, and epidemiological information. The expected result is Negative.  Fact Sheet for Patients:  BloggerCourse.com  Fact Sheet for Healthcare Providers:  SeriousBroker.it  This test is no                          t yet approved or cleared by the [place] FDA and  has been authorized for detection and/or diagnosis of SARS-CoV-2 by FDA under an Emergency Use Authorization (EUA). This EUA will remain  in effect (meaning this test can be used) for the duration of the COVID-19 declaration under Section 564(b)(1) of the Act, 21 U.S.C.section 360bbb-3(b)(1), unless the authorization is terminated  or revoked sooner.       Influenza A by PCR 10/17/2021 NEGATIVE  NEGATIVE Final   Influenza B by PCR 10/17/2021 NEGATIVE  NEGATIVE Final   Comment: (NOTE) The Xpert Xpress SARS-CoV-2/FLU/RSV plus assay is intended as an aid in the diagnosis of influenza from Nasopharyngeal swab specimens and should not be used as a sole basis for treatment. Nasal washings and aspirates are unacceptable for Xpert Xpress SARS-CoV-2/FLU/RSV testing.  Fact Sheet for Patients: BloggerCourse.com  Fact Sheet for Healthcare Providers: SeriousBroker.it  This test is not yet approved or cleared by the [place] FDA and has been authorized for detection and/or diagnosis of SARS-CoV-2 by FDA under an Emergency Use Authorization (EUA). This EUA will remain in effect (meaning this test can be used) for the  duration of the COVID-19 declaration under Section 564(b)(1) of the Act, 21 U.S.C. section 360bbb-3(b)(1), unless the authorization is terminated or revoked.  Performed at [facility], [address], [place], [place] [address]    WBC 10/17/2021 4.9  4.0 - 10.5 K/uL Final  RBC 10/17/2021 4.57  3.87 - 5.11 MIL/uL Final   Hemoglobin 10/17/2021 15.6 (H)  12.0 - 15.0 g/dL Final   HCT 96/08/5407 45.2  36.0 - 46.0 % Final   MCV 10/17/2021 98.9  80.0 - 100.0 fL Final   MCH 10/17/2021 34.1 (H)  26.0 - 34.0 pg Final   MCHC 10/17/2021 34.5  30.0 - 36.0 g/dL Final   RDW 81/19/1478 13.2  11.5 - 15.5 % Final   Platelets 10/17/2021 353  150 - 400 K/uL Final   nRBC 10/17/2021 0.0  0.0 - 0.2 % Final   Neutrophils Relative % 10/17/2021 41  % Final   Neutro Abs 10/17/2021 2.0  1.7 - 7.7 K/uL Final   Lymphocytes Relative 10/17/2021 43  % Final   Lymphs Abs 10/17/2021 2.1  0.7 - 4.0 K/uL Final   Monocytes Relative 10/17/2021 11  % Final   Monocytes Absolute 10/17/2021 0.5  0.1 - 1.0 K/uL Final   Eosinophils Relative 10/17/2021 2  % Final   Eosinophils Absolute 10/17/2021 0.1  0.0 - 0.5 K/uL Final   Basophils Relative 10/17/2021 3  % Final   Basophils Absolute 10/17/2021 0.1  0.0 - 0.1 K/uL Final   Immature Granulocytes 10/17/2021 0  % Final   Abs Immature Granulocytes 10/17/2021 0.01  0.00 - 0.07 K/uL Final   Performed at [facility], [address] [place], [place] [address]   Sodium 10/17/2021 142  135 - 145 mmol/L Final   Potassium 10/17/2021 4.5  3.5 - 5.1 mmol/L Final   Chloride 10/17/2021 104  98 - 111 mmol/L Final   CO2 10/17/2021 21 (L)  22 - 32 mmol/L Final   Glucose, Bld 10/17/2021 93  70 - 99 mg/dL Final   Glucose reference range applies only to samples taken after fasting for at least 8 hours.   BUN 10/17/2021 9  6 - 20 mg/dL Final   Creatinine, Ser 10/17/2021 0.62  0.44 - 1.00 mg/dL Final   Calcium 13/12/6576 9.5  8.9 - 10.3 mg/dL Final   Total Protein 46/96/2952 7.8   6.5 - 8.1 g/dL Final   Albumin 84/13/2440 4.9  3.5 - 5.0 g/dL Final   AST 03/16/2535 271 (H)  15 - 41 U/L Final   ALT 10/17/2021 179 (H)  0 - 44 U/L Final   Alkaline Phosphatase 10/17/2021 141 (H)  38 - 126 U/L Final   Total Bilirubin 10/17/2021 0.4  0.3 - 1.2 mg/dL Final   GFR, Estimated 10/17/2021 >60  >60 mL/min Final   Comment: (NOTE) Calculated using the CKD-EPI Creatinine Equation (2021)    Anion gap 10/17/2021 17 (H)  5 - 15 Final   Performed at [facility], [address] [place], [place] [address]   Hgb A1c MFr Bld 10/17/2021 5.4  4.8 - 5.6 % Final   Comment: (NOTE) Pre diabetes:          5.7%-6.4%  Diabetes:              >6.4%  Glycemic control for   <7.0% adults with diabetes    Mean Plasma Glucose 10/17/2021 108.28  mg/dL Final   Performed at [facility], [address] [place], [place] [address]   Alcohol, Ethyl (B) 10/17/2021 370 (HH)  <10 mg/dL Final   Comment: ATTEMPTED TP CALL X3 LEFT MESSAGE CALL NOT RETURNED CRITICAL RESULT CALLED TO, READ BACK BY AND VERIFIED WITH: [place] S,RN 10/18/21 0321 WAYK Performed at [facility], [address] [place], [place] [address]    Cholesterol 10/17/2021 407 (H)  0 - 200 mg/dL Final   Triglycerides 16/96/7893 75  <150 mg/dL Final   HDL 81/05/7508 >135  >40 mg/dL Final   REPEATED TO VERIFY   Total CHOL/HDL Ratio 10/17/2021 NOT CALCULATED  RATIO Final   VLDL 10/17/2021 15  0 - 40 mg/dL Final   LDL Cholesterol 10/17/2021 NOT CALCULATED  0 - 99 mg/dL Final   Performed at [facility], [address], [place], [place] [address]   Magnesium 10/17/2021 2.4  1.7 - 2.4 mg/dL Final   Performed at [facility], [address] [place], [place] [address]   TSH 10/17/2021 2.222  0.350 - 4.500 uIU/mL Final   Comment: Performed by a 3rd Generation assay with a functional sensitivity of <=0.01 uIU/mL. Performed at [facility], [address], [place], [place] [address]    POC Amphetamine UR  10/17/2021 None Detected  NONE DETECTED (Cut Off Level 1000 ng/mL) Final   POC Secobarbital (BAR) 10/17/2021 None Detected  NONE DETECTED (Cut Off Level 300 ng/mL) Final   POC Buprenorphine (BUP) 10/17/2021 None Detected  NONE DETECTED (Cut Off Level 10 ng/mL) Final   POC Oxazepam (BZO) 10/17/2021 None Detected  NONE DETECTED (Cut Off Level 300 ng/mL) Final   POC Cocaine UR 10/17/2021 None Detected  NONE DETECTED (Cut Off Level 300 ng/mL) Final   POC Methamphetamine UR 10/17/2021 None Detected  NONE DETECTED (Cut Off Level 1000 ng/mL) Final   POC Morphine 10/17/2021 Positive (A)  NONE DETECTED (Cut Off Level 300 ng/mL) Final   POC Methadone UR 10/17/2021 None Detected  NONE DETECTED (Cut Off Level 300 ng/mL) Final   POC Oxycodone UR 10/17/2021 Positive (A)  NONE DETECTED (Cut Off Level 100 ng/mL) Final   POC Marijuana UR 10/17/2021 None Detected  NONE DETECTED (Cut Off Level 50 ng/mL) Final   SARSCOV2ONAVIRUS 2 AG 10/17/2021 NEGATIVE  NEGATIVE Final   Comment: (NOTE) SARS-CoV-2 antigen NOT DETECTED.   Negative results are presumptive.  Negative results do not preclude SARS-CoV-2 infection and should not be used as the sole basis for treatment or other patient management decisions, including infection  control decisions, particularly in the presence of clinical signs and  symptoms consistent with COVID-19, or in those who have been in contact with the virus.  Negative results must be combined with clinical observations, patient history, and epidemiological information. The expected result is Negative.  Fact Sheet for Patients: https://www.jennings-kim.com/  Fact Sheet for Healthcare Providers: https://alexander-rogers.biz/  This test is not yet approved or cleared by the [place] FDA and  has been authorized for detection and/or diagnosis of SARS-CoV-2 by FDA under an Emergency Use Authorization (EUA).  This EUA will remain in effect (meaning this test can  be used) for the duration of  the COV                          ID-19 declaration under Section 564(b)(1) of the Act, 21 U.S.C. section 360bbb-3(b)(1), unless the authorization is terminated or revoked sooner.      Allergies: Penicillins, Sulfa antibiotics, and Ketorolac  PTA Medications: (Not in a hospital admission)   Long Term Goals: Improvement in symptoms so as ready for discharge  Short Term Goals: Ability to identify changes in lifestyle to reduce recurrence of condition will improve, Ability to verbalize feelings will improve, Ability to disclose and discuss suicidal ideas, Ability  to demonstrate self-control will improve, Ability to maintain clinical measurements within normal limits will improve, and Compliance with prescribed medications will improve  Medical Decision Making  Inpatient FBC  Lab Orders         Resp Panel by RT-PCR (Flu A&B, Covid) Anterior Nasal Swab         CBC with Differential/Platelet         Comprehensive metabolic panel         Hemoglobin A1c         Ethanol         Lipid panel         Magnesium         TSH         Pregnancy, urine         POCT Urine Drug Screen - (I-Screen)         POC SARS Coronavirus 2 Ag      Meds ordered this encounter  Medications   acetaminophen (TYLENOL) tablet 650 mg   alum & mag hydroxide-simeth (MAALOX/MYLANTA) 200-200-20 MG/5ML suspension 30 mL   magnesium hydroxide (MILK OF MAGNESIA) suspension 30 mL   DISCONTD: thiamine (B-1) injection 100 mg   thiamine tablet 100 mg   multivitamin with minerals tablet 1 tablet   LORazepam (ATIVAN) tablet 1 mg   hydrOXYzine (ATARAX) tablet 25 mg   loperamide (IMODIUM) capsule 2-4 mg   ondansetron (ZOFRAN-ODT) disintegrating tablet 4 mg   nicotine (NICODERM CQ - dosed in mg/24 hours) patch 14 mg       Recommendations  Based on my evaluation the patient does not appear to have an emergency medical condition.  Sindy Guadeloupeoy Jamari Moten, NP 10/18/21  5:45 AM
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
## 2021-10-17 NOTE — Progress Notes (Signed)
Pt presents with vague SI, No HI or A/V hallucinations.  Pt has been drinking ETOH (vodka) today.  She drinks about half a 5th daily.  Pt is wanting to go into detox.  Pt is urgent.

## 2021-10-18 DIAGNOSIS — F339 Major depressive disorder, recurrent, unspecified: Secondary | ICD-10-CM | POA: Diagnosis not present

## 2021-10-18 DIAGNOSIS — F1120 Opioid dependence, uncomplicated: Secondary | ICD-10-CM | POA: Diagnosis not present

## 2021-10-18 DIAGNOSIS — F1994 Other psychoactive substance use, unspecified with psychoactive substance-induced mood disorder: Secondary | ICD-10-CM | POA: Diagnosis not present

## 2021-10-18 DIAGNOSIS — F10230 Alcohol dependence with withdrawal, uncomplicated: Secondary | ICD-10-CM | POA: Diagnosis not present

## 2021-10-18 LAB — LIPID PANEL
Cholesterol: 407 mg/dL — ABNORMAL HIGH (ref 0–200)
HDL: >135 mg/dL (ref >40)
Triglycerides: 75 mg/dL (ref <150)
VLDL: 15 mg/dL (ref 0–40)

## 2021-10-18 LAB — RESP PANEL BY RT-PCR (FLU A&B, COVID) ARPGX2
Influenza A by PCR: NEGATIVE
Influenza B by PCR: NEGATIVE
SARS Coronavirus 2 by RT PCR: NEGATIVE

## 2021-10-18 LAB — ETHANOL: Alcohol, Ethyl (B): 370 mg/dL (ref <10)

## 2021-10-18 LAB — COMPREHENSIVE METABOLIC PANEL
ALT: 179 U/L — ABNORMAL HIGH (ref 0–44)
AST: 271 U/L — ABNORMAL HIGH (ref 15–41)
Albumin: 4.9 g/dL (ref 3.5–5.0)
Alkaline Phosphatase: 141 U/L — ABNORMAL HIGH (ref 38–126)
Anion gap: 17 — ABNORMAL HIGH (ref 5–15)
BUN: 9 mg/dL (ref 6–20)
CO2: 21 mmol/L — ABNORMAL LOW (ref 22–32)
Calcium: 9.5 mg/dL (ref 8.9–10.3)
Chloride: 104 mmol/L (ref 98–111)
Creatinine, Ser: 0.62 mg/dL (ref 0.44–1.00)
GFR, Estimated: >60 mL/min (ref >60)
Glucose, Bld: 93 mg/dL (ref 70–99)
Potassium: 4.5 mmol/L (ref 3.5–5.1)
Sodium: 142 mmol/L (ref 135–145)
Total Bilirubin: 0.4 mg/dL (ref 0.3–1.2)
Total Protein: 7.8 g/dL (ref 6.5–8.1)

## 2021-10-18 LAB — POCT PREGNANCY, URINE: Preg Test, Ur: NEGATIVE

## 2021-10-18 LAB — TSH: TSH: 2.222 u[IU]/mL (ref 0.350–4.500)

## 2021-10-18 LAB — MAGNESIUM: Magnesium: 2.4 mg/dL (ref 1.7–2.4)

## 2021-10-18 MED ORDER — LORAZEPAM 1 MG PO TABS
1.0000 mg | ORAL_TABLET | Freq: Four times a day (QID) | ORAL | Status: AC
  Administered 2021-10-18 – 2021-10-19 (×4): 1 mg via ORAL
  Filled 2021-10-18 (×3): qty 1

## 2021-10-18 MED ORDER — LORAZEPAM 1 MG PO TABS
1.0000 mg | ORAL_TABLET | Freq: Three times a day (TID) | ORAL | Status: DC
  Filled 2021-10-18: qty 1

## 2021-10-18 MED ORDER — LORAZEPAM 1 MG PO TABS: 1.0000 mg | ORAL_TABLET | Freq: Every day | ORAL | Status: DC

## 2021-10-18 MED ORDER — LORAZEPAM 1 MG PO TABS: 1.0000 mg | ORAL_TABLET | Freq: Two times a day (BID) | ORAL | Status: DC

## 2021-10-18 NOTE — Progress Notes (Signed)
Tiffany Swanson continued to c/o discomfort withdrawal symptoms throughout the day. She eventually got up and ate lunch and dinner. This afternoon she remained OOB in the milkieu watching the TV and social with her peers.

## 2021-10-18 NOTE — ED Notes (Signed)
Received patient this PM. Patient interacting with peers in dayroom watching a movie. Patient concerned about leaving tomorrow and not being on medication ...asked questions concerning ativan taper. Explained that she would still take medication until even if she is discharged tomorrow. Patient compliant with medication administration. Patient went to room to lay rest. Patient said she is tossing and turning and will need sleep medication. No acute distress noted. Patient respirations are even and unlabored. Will continue to monitor for safety.

## 2021-10-18 NOTE — Progress Notes (Signed)
Received Tiffany Swanson this AM asleep in her bed, she woke up and requested medication for tremors and nausea. Her request was met, VS rechecked. She denied feeling suicidal and depressed. She endorsed feeling anxious. She is resting quietly in her bed at the present time without distress.

## 2021-10-18 NOTE — ED Notes (Signed)
Patient refused group 

## 2021-10-18 NOTE — ED Provider Notes (Addendum)
Behavioral Health Progress Note  Date and Time: 10/18/2021 1:40 PM Name: Tiffany Swanson MRN:  [id number]  Subjective:   41 year old female with history of ADHD, alcohol use, and per chart review  opioid dependence, anxiety, and depression [facility] for alcohol detox.  Patient had reported consuming 1/5 of liquor daily for the last 2 years.  [facility] and started on CIWA protocol.  EtOH 370; UDS + morphine, oxycodone.  Patient interviewed in conjunction with LCSW this morning.  Patient is found in her room sitting in bed in no acute distress.  Patient describes her mood as "terrible".  She goes on to state that she is "very shaky and I have a constant headache.  I am very dizzy".  Patient reports alcohol withdrawal symptoms of nausea, diarrhea, headache and tremors.  She appears in moderate distress and part way through the interview requests an emesis bag.  She states that she has never went through detox before.  Patient affirms that she has being been drinking 1/5 of vodka a day for the last 2 years.  She denies SI/HI/AVH.  She states that once he completes detox she would be interested in outpatient treatment.  Discussed with patient that at this time stimulants will not be restarted in the setting of alcohol detox/facility based crisis admission.  Patient verbalized understanding and was in agreement.  Recent stressors are  mother passed away 11/11/19 and  husband left her on 12/03/20.Discussed that due to severity of alcohol withdrawal symptoms it is recommended that an Ativan taper be started-patient verbalized understanding and was in agreement.    Per chart review Patient was previously prescribed opioid medication for control of pain; however, per documentation  prescription was discontinued 06/29/2021 due patient being referred to rheumatology. Per PMP, most recent rx for opioids was in April 2023 for #15 5  mg hydrocodone.  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  Substance Use (with emphasis over the last 12 months) Recreational Drugs: denied Use of Alcohol: heavy Tobacco Use: no Rehab History: no H/O Complicated Withdrawal: no  Legal History: None reported  Family Psychiatric History: Denies psychiatric history Denies substance use history Denies history of attempted or completed suicides    Diagnosis:  Final diagnoses:  Alcohol abuse  Anxious mood  Alcohol withdrawal syndrome without complication (HCC)  Substance induced mood disorder (HCC)    Total Time spent with patient: 30 minutes  Past Psychiatric History: ADHD, AUD; depression, anxiety and opioid dependence per chart review Past Medical History:  Past Medical History:  Diagnosis Date   ADD (attention deficit disorder)    Anxiety    Depression    Insomnia    Shingles     Past Surgical History:  Procedure Laterality Date   ABDOMINAL HYSTERECTOMY     2017, 2020   TONSILLECTOMY     age 41   Family History:  Family History  Problem Relation Age of Onset   Rheum arthritis Mother    Liver disease Mother    Kidney disease Mother    Diabetes Mother    COPD Father    Hypertension Father    Osteoarthritis Father    Rheum arthritis Sister    Osteoarthritis Sister    Healthy Daughter    Healthy Son    Family Psychiatric  History:  Denies  psychiatric history Denies substance use history Denies history of attempted or completed suicides  Social History:  Social History   Substance and Sexual Activity  Alcohol Use Yes   Comment: occ     Social History   Substance and Sexual Activity  Drug Use Never    Social History   Socioeconomic History   Marital status: Married    Spouse name: Not on file   Number of children: Not on file   Years of education: Not on file   Highest education level: Not on file  Occupational History   Not on file  Tobacco Use   Smoking status: Former    Years: 10.00    Types: Cigarettes    Quit date: 2009    Years since quitting: 14.4   Smokeless tobacco: Never  Vaping Use   Vaping Use: Never used  Substance and Sexual Activity   Alcohol use: Yes    Comment: occ   Drug use: Never   Sexual activity: Not on file  Other Topics Concern   Not on file  Social History Narrative   Not on file   Social Determinants of Health   Financial Resource Strain: Not on file  Food Insecurity: Not on file  Transportation Needs: Not on file  Physical Activity: Not on file  Stress: Not on file  Social Connections: Not on file   SDOH:  SDOH Screenings   Alcohol Screen: Not on file  Depression (PHQ2-9): Medium Risk   PHQ-2 Score: 17  Financial Resource Strain: Not on file  Food Insecurity: Not on file  Housing: Not on file  Physical Activity: Not on file  Social Connections: Not on file  Stress: Not on file  Tobacco Use: Not on file  Transportation Needs: Not on file   Additional Social History:    Pain Medications: Hydrocodone 5mg  twice a day Prescriptions: Clonopin .5mg  once daily; Adderall ER 25mg  once daily; Valtrex 1000mg  once per day Over the Counter: Acetaminophen and Ibuprophen as needed History of alcohol / drug use?: Yes Withdrawal Symptoms: Patient aware of relationship between substance abuse and physical/medical complications, Nausea / Vomiting, Change in blood  pressure, Weakness, Tremors, Tachycardia, Cramps, Agitation Name of Substance 1: ETOH 1 - Age of First Use: Teens 1 - Amount (size/oz): Half a fifth 1 - Frequency: daily 1 - Duration: for the last two years 1 - Last Use / Amount: 05/31 around 20:30 1 - Method of Aquiring: purchase 1- Route of Use: oral                  Sleep: Fair  Appetite:  Poor  Current Medications:  Current Facility-Administered Medications  Medication Dose Route Frequency Provider Last Rate Last Admin   acetaminophen (TYLENOL) tablet 650 mg  650 mg Oral Q6H PRN , NP   650 mg at 10/18/21 1242   alum & mag hydroxide-simeth (MAALOX/MYLANTA) 200-200-20 MG/5ML suspension 30 mL  30 mL Oral Q4H PRN 6/31, NP       hydrOXYzine (ATARAX) tablet 25 mg  25 mg Oral Q6H  PRN Sindy Guadeloupe, NP   25 mg at 10/18/21 0825   loperamide (IMODIUM) capsule 2-4 mg  2-4 mg Oral PRN Sindy Guadeloupe, NP       LORazepam (ATIVAN) tablet 1 mg  1 mg Oral Q6H PRN Sindy Guadeloupe, NP   1 mg at 10/18/21 1242   LORazepam (ATIVAN) tablet 1 mg  1 mg Oral QID Estella Husk, MD       Followed by   Melene Muller ON 10/19/2021] LORazepam (ATIVAN) tablet 1 mg  1 mg Oral TID Estella Husk, MD       Followed by   Melene Muller ON 10/20/2021] LORazepam (ATIVAN) tablet 1 mg  1 mg Oral BID Estella Husk, MD       Followed by   Melene Muller ON 10/22/2021] LORazepam (ATIVAN) tablet 1 mg  1 mg Oral Daily Estella Husk, MD       magnesium hydroxide (MILK OF MAGNESIA) suspension 30 mL  30 mL Oral Daily PRN Sindy Guadeloupe, NP       multivitamin with minerals tablet 1 tablet  1 tablet Oral Daily Sindy Guadeloupe, NP   1 tablet at 10/18/21 1242   nicotine (NICODERM CQ - dosed in mg/24 hours) patch 14 mg  14 mg Transdermal Once Sindy Guadeloupe, NP       ondansetron (ZOFRAN-ODT) disintegrating tablet 4 mg  4 mg Oral Q6H PRN Sindy Guadeloupe, NP   4 mg at 10/18/21 0825   thiamine tablet 100 mg  100 mg Oral Daily Sindy Guadeloupe, NP   100 mg at 10/18/21  1243   Current Outpatient Medications  Medication Sig Dispense Refill   fluticasone (FLONASE) 50 MCG/ACT nasal spray Place 2 sprays into the nose daily as needed for allergies.     amphetamine-dextroamphetamine (ADDERALL XR) 25 MG 24 hr capsule Take 25 mg by mouth in the morning.     amphetamine-dextroamphetamine (ADDERALL) 20 MG tablet Take 20 mg by mouth daily at 12 noon.     clonazePAM (KLONOPIN) 0.5 MG tablet Take 0.5 mg by mouth daily as needed for anxiety.     HYDROcodone-acetaminophen (NORCO/VICODIN) 5-325 MG tablet Take 1 tablet by mouth 3 (three) times daily as needed. She says she takes this twice daily per controlled substance database #15 tabs LF 09/04/21     ondansetron (ZOFRAN) 8 MG tablet Take 8 mg by mouth 3 (three) times daily as needed.     valACYclovir (VALTREX) 1000 MG tablet Take 1,000 mg by mouth daily as needed. For cold sores      Labs  Lab Results:  Admission on 10/17/2021  Component Date Value Ref Range Status   SARS Coronavirus 2 by RT PCR 10/17/2021 NEGATIVE  NEGATIVE Final   Comment: (NOTE) SARS-CoV-2 target nucleic acids are NOT DETECTED.  The SARS-CoV-2 RNA is generally detectable in upper respiratory specimens during the acute phase of infection. The lowest concentration of SARS-CoV-2 viral copies this assay can detect is 138 copies/mL. A negative result does not preclude SARS-Cov-2 infection and should not be used as the sole basis for treatment or other patient management decisions. A negative result may occur with  improper specimen collection/handling, submission of specimen other than nasopharyngeal swab, presence of viral mutation(s) within the areas targeted by this assay, and inadequate number of viral copies(<138 copies/mL). A negative result must be combined with clinical observations, patient history, and epidemiological information. The expected result is Negative.  Fact Sheet for Patients:   BloggerCourse.com  Fact Sheet for Healthcare Providers:  SeriousBroker.it  This test is no                          t yet approved or cleared by the [place] and  has been authorized for detection and/or diagnosis of SARS-CoV-2 by FDA under an Emergency Use Authorization (EUA). This EUA will remain  in effect (meaning this test can be used) for the duration of the COVID-19 declaration under Section 564(b)(1) of the Act, 21 U.S.C.section 360bbb-3(b)(1), unless the authorization is terminated  or revoked sooner.       Influenza A by PCR 10/17/2021 NEGATIVE  NEGATIVE Final   Influenza B by PCR 10/17/2021 NEGATIVE  NEGATIVE Final   Comment: (NOTE) The Xpert Xpress SARS-CoV-2/FLU/RSV plus assay is intended as an aid in the diagnosis of influenza from Nasopharyngeal swab specimens and should not be used as a sole basis for treatment. Nasal washings and aspirates are unacceptable for Xpert Xpress SARS-CoV-2/FLU/RSV testing.  Fact Sheet for Patients: BloggerCourse.com  Fact Sheet for Healthcare Providers: SeriousBroker.it  This test is not yet approved or cleared by the [place] FDA and has been authorized for detection and/or diagnosis of SARS-CoV-2 by FDA under an Emergency Use Authorization (EUA). This EUA will remain in effect (meaning this test can be used) for the duration of the COVID-19 declaration under Section 564(b)(1) of the Act, 21 U.S.C. section 360bbb-3(b)(1), unless the authorization is terminated or revoked.  Performed at [facility], [address], [place], [place] [address]    WBC 10/17/2021 4.9  4.0 - 10.5 K/uL Final   RBC 10/17/2021 4.57  3.87 - 5.11 MIL/uL Final   Hemoglobin 10/17/2021 15.6 (H)  12.0 - 15.0 g/dL Final   HCT 61/60/7371 45.2  36.0 - 46.0 % Final   MCV 10/17/2021 98.9  80.0 - 100.0 fL Final   MCH 10/17/2021 34.1 (H)  26.0  - 34.0 pg Final   MCHC 10/17/2021 34.5  30.0 - 36.0 g/dL Final   RDW 11/13/9483 13.2  11.5 - 15.5 % Final   Platelets 10/17/2021 353  150 - 400 K/uL Final   nRBC 10/17/2021 0.0  0.0 - 0.2 % Final   Neutrophils Relative % 10/17/2021 41  % Final   Neutro Abs 10/17/2021 2.0  1.7 - 7.7 K/uL Final   Lymphocytes Relative 10/17/2021 43  % Final   Lymphs Abs 10/17/2021 2.1  0.7 - 4.0 K/uL Final   Monocytes Relative 10/17/2021 11  % Final   Monocytes Absolute 10/17/2021 0.5  0.1 - 1.0 K/uL Final   Eosinophils Relative 10/17/2021 2  % Final   Eosinophils Absolute 10/17/2021 0.1  0.0 - 0.5 K/uL Final   Basophils Relative 10/17/2021 3  % Final   Basophils Absolute 10/17/2021 0.1  0.0 - 0.1 K/uL Final   Immature Granulocytes 10/17/2021 0  % Final   Abs Immature Granulocytes 10/17/2021 0.01  0.00 - 0.07 K/uL Final   Performed at [facility], [address] [place], [place] [address]   Sodium 10/17/2021 142  135 - 145 mmol/L Final   Potassium 10/17/2021 4.5  3.5 - 5.1 mmol/L Final   Chloride 10/17/2021 104  98 - 111 mmol/L Final   CO2 10/17/2021 21 (L)  22 - 32 mmol/L Final   Glucose, Bld 10/17/2021 93  70 - 99 mg/dL Final   Glucose reference range applies only to samples taken after fasting for at least 8 hours.   BUN 10/17/2021  9  6 - 20 mg/dL Final   Creatinine, Ser 10/17/2021 0.62  0.44 - 1.00 mg/dL Final   Calcium 29/56/2130 9.5  8.9 - 10.3 mg/dL Final   Total Protein 86/57/8469 7.8  6.5 - 8.1 g/dL Final   Albumin 62/95/2841 4.9  3.5 - 5.0 g/dL Final   AST 32/44/0102 271 (H)  15 - 41 U/L Final   ALT 10/17/2021 179 (H)  0 - 44 U/L Final   Alkaline Phosphatase 10/17/2021 141 (H)  38 - 126 U/L Final   Total Bilirubin 10/17/2021 0.4  0.3 - 1.2 mg/dL Final   GFR, Estimated 10/17/2021 >60  >60 mL/min Final   Comment: (NOTE) Calculated using the CKD-EPI Creatinine Equation (2021)    Anion gap 10/17/2021 17 (H)  5 - 15 Final   Performed at [facility], [address],  [place], [place] [address]   Hgb A1c MFr Bld 10/17/2021 5.4  4.8 - 5.6 % Final   Comment: (NOTE) Pre diabetes:          5.7%-6.4%  Diabetes:              >6.4%  Glycemic control for   <7.0% adults with diabetes    Mean Plasma Glucose 10/17/2021 108.28  mg/dL Final   Performed at [facility], [address], [place], [place] [address]   Alcohol, Ethyl (B) 10/17/2021 370 (HH)  <10 mg/dL Final   Comment: ATTEMPTED TP CALL X3 LEFT MESSAGE CALL NOT RETURNED CRITICAL RESULT CALLED TO, READ BACK BY AND VERIFIED WITH: [place] S,RN 10/18/21 0321 WAYK Performed at [facility], [address], [place], [place] [address]    Cholesterol 10/17/2021 407 (H)  0 - 200 mg/dL Final   Triglycerides 95/63/8756 75  <150 mg/dL Final   HDL 43/32/9518 >135  >40 mg/dL Final   REPEATED TO VERIFY   Total CHOL/HDL Ratio 10/17/2021 NOT CALCULATED  RATIO Final   VLDL 10/17/2021 15  0 - 40 mg/dL Final   LDL Cholesterol 10/17/2021 NOT CALCULATED  0 - 99 mg/dL Final   Performed at [facility], [address], [place], [place] [address]   Magnesium 10/17/2021 2.4  1.7 - 2.4 mg/dL Final   Performed at [facility], [address], [place], [place] [address]   TSH 10/17/2021 2.222  0.350 - 4.500 uIU/mL Final   Comment: Performed by a 3rd Generation assay with a functional sensitivity of <=0.01 uIU/mL. Performed at [facility], [address] [place], [place] [address]    POC Amphetamine UR 10/17/2021 None Detected  NONE DETECTED (Cut Off Level 1000 ng/mL) Final   POC Secobarbital (BAR) 10/17/2021 None Detected  NONE DETECTED (Cut Off Level 300 ng/mL) Final   POC Buprenorphine (BUP) 10/17/2021 None Detected  NONE DETECTED (Cut Off Level 10 ng/mL) Final   POC Oxazepam (BZO) 10/17/2021 None Detected  NONE DETECTED (Cut Off Level 300 ng/mL) Final   POC Cocaine UR 10/17/2021 None Detected  NONE DETECTED (Cut Off Level 300 ng/mL) Final   POC Methamphetamine UR 10/17/2021 None Detected  NONE  DETECTED (Cut Off Level 1000 ng/mL) Final   POC Morphine 10/17/2021 Positive (A)  NONE DETECTED (Cut Off Level 300 ng/mL) Final   POC Methadone UR 10/17/2021 None Detected  NONE DETECTED (Cut Off Level 300 ng/mL) Final   POC Oxycodone UR 10/17/2021 Positive (A)  NONE DETECTED (Cut Off Level 100 ng/mL) Final   POC Marijuana UR 10/17/2021 None Detected  NONE DETECTED (Cut Off Level 50 ng/mL) Final   SARSCOV2ONAVIRUS 2 AG 10/17/2021 NEGATIVE  NEGATIVE Final   Comment: (NOTE) SARS-CoV-2 antigen NOT DETECTED.   Negative results are presumptive.  Negative results do not preclude SARS-CoV-2 infection and should not be used as the sole basis for treatment or other patient management decisions, including infection  control decisions, particularly in the presence of clinical signs and  symptoms consistent with COVID-19, or in those who have been in contact with the virus.  Negative results must be combined with clinical observations, patient history, and epidemiological information. The expected result is Negative.  Fact Sheet for Patients: https://www.jennings-kim.com/  Fact Sheet for Healthcare Providers: https://alexander-rogers.biz/  This test is not yet approved or cleared by the [place] FDA and  has been authorized for detection and/or diagnosis of SARS-CoV-2 by FDA under an Emergency Use Authorization (EUA).  This EUA will remain in effect (meaning this test can be used) for the duration of  the COV                          ID-19 declaration under Section 564(b)(1) of the Act, 21 U.S.C. section 360bbb-3(b)(1), unless the authorization is terminated or revoked sooner.     Preg Test, Ur 10/17/2021 NEGATIVE  NEGATIVE Final   Comment:        THE SENSITIVITY OF THIS METHODOLOGY IS >24 mIU/mL     Blood Alcohol level:  Lab Results  Component Value Date   ETH 370 (HH) 10/17/2021    Metabolic Disorder Labs: Lab Results  Component Value Date   HGBA1C  5.4 10/17/2021   MPG 108.28 10/17/2021   No results found for: PROLACTIN Lab Results  Component Value Date   CHOL 407 (H) 10/17/2021   TRIG 75 10/17/2021   HDL >135 10/17/2021   CHOLHDL NOT CALCULATED 10/17/2021   VLDL 15 10/17/2021   LDLCALC NOT CALCULATED 10/17/2021    Therapeutic Lab Levels: No results found for: LITHIUM No results found for: VALPROATE No components found for:  CBMZ  Physical Findings   PHQ2-9    Flowsheet Row ED from 10/17/2021 in [facility]  PHQ-2 Total Score 2  PHQ-9 Total Score 17      Flowsheet Row ED from 10/17/2021 in [facility]  C-SSRS RISK CATEGORY Low Risk        Musculoskeletal  Strength & Muscle Tone: within normal limits Gait & Station: normal Patient leans: N/A  Psychiatric Specialty Exam  Presentation  General Appearance: Bizarre  Eye Contact:Fair  Speech:Blocked  Speech Volume:Decreased  Handedness:Ambidextrous   Mood and Affect  Mood:Anxious  Affect:Non-Congruent   Thought Process  Thought Processes:Disorganized  Descriptions of Associations:Loose  Orientation:Partial  Thought Content:WDL  Diagnosis of Schizophrenia or Schizoaffective disorder in past: No    Hallucinations:Hallucinations: None  Ideas of Reference:None  Suicidal Thoughts:Suicidal Thoughts: No  Homicidal Thoughts:Homicidal Thoughts: No   Sensorium  Memory:Immediate Fair  Judgment:Poor  Insight:Poor   Executive Functions  Concentration:Poor  Attention Span:Poor  Recall:Fair  Fund of Knowledge:Fair  Language:Fair   Psychomotor Activity  Psychomotor Activity:Psychomotor Activity: Normal   Assets  Assets:Desire for Improvement   Sleep  Sleep:Sleep: Poor   Nutritional Assessment (For OBS and FBC admissions only) Has the patient had a weight loss or gain of 10 pounds or more in the last 3 months?: No Has the patient had a decrease in food intake/or  appetite?: No Does the patient have  dental problems?: No Does the patient have eating habits or behaviors that may be indicators of an eating disorder including binging or inducing vomiting?: No Has the patient recently lost weight without trying?: 0 Has the patient been eating poorly because of a decreased appetite?: 0 Malnutrition Screening Tool Score: 0    Physical Exam  Physical Exam Constitutional:      Appearance: Normal appearance. She is normal weight.  HENT:     Head: Normocephalic and atraumatic.  Pulmonary:     Effort: Pulmonary effort is normal.  Neurological:     Mental Status: She is alert and oriented to person, place, and time.     Comments: Tremor in BUE with outstretched hands   Review of Systems  Constitutional:  Positive for diaphoresis. Negative for chills and fever.  HENT:  Negative for hearing loss.   Eyes:  Negative for discharge and redness.  Respiratory:  Negative for cough.   Cardiovascular:  Negative for chest pain.  Gastrointestinal:  Positive for diarrhea and nausea. Negative for abdominal pain.  Musculoskeletal:  Negative for myalgias.  Neurological:  Positive for tremors and headaches.  Psychiatric/Behavioral:  Positive for substance abuse. Negative for hallucinations and suicidal ideas. The patient is nervous/anxious.   Blood pressure 120/80, pulse 96, temperature 98.7 F (37.1 C), temperature source Oral, resp. rate 19, SpO2 100 %. There is no height or weight on file to calculate BMI.  Treatment Plan Summary: 41 year old female with history of ADHD, alcohol use, and per chart review  opioid dependence, anxiety, and depression who presented to the [facility] behavioral health urgent care on 10/17/2021 for alcohol detox.  Patient had reported consuming 1/5 of liquor daily for the last 2 years.  Patient was admitted to the [facility] and started on CIWA protocol.  EtOH 370; UDS + morphine, oxycodone.  Most recent CIWA 11. Patient agreeable to starting  ativan taper. Patient is tremulous on interview and reports tremors, nausea, headache, diarrhea. Patient denies SI/HI/AVH. Will explore depressive sx in more depth once withdrawal sx are more under control. UDS+opioids; however, per PMP has not been prescribed opioids since April, patient denies recreational drug use. Patient remains appropriate for continued treatment on the [facility] for detox and crisis stabilization-taper scheduled to end 10/22/21.    AUD, severe -continue CIWA protocol -start ativan detox- 1 mg QID --> 1 mg TID--> 1 mg BID--> 1 mg daily -multivitamin -thiamine  Nicotine dependence -nicoderm cq patch 14 mg   Dispo: anticipate dc to self care once completes taper. Patient reports interest in outpatient substance use treatment once taper completed 10/22/21  Estella Husk, MD 10/18/2021 1:40 PM

## 2021-10-18 NOTE — Clinical Social Work Psych Note (Signed)
LCSW Initial Note  LCSW met with Icelynn for introduction and to begin discussions regarding treatment and potential discharge planning. Dr. Bronwen Betters, MD accompanied this writer.   Ana presented with a dysphoric affect, congruent mood. Pebble denied having any SI, Hi or AVH at this time, however she did have complaints of withdrawal symptoms and physical pain. Syndi reported withdrawal symptoms of nausea, tremors, headache and sweating.   Dr. Bronwen Betters explained to the patient that those were common symptoms of alcohol withdrawal and that the process can be very unpleasant. Dr. Bronwen Betters ensured Shayle that there will be mediations to assist with her withdrawals as well as her being started on an Ativan taper protocol.   Elynore initially presented to the [facility] seeking assistance with detox services for alcohol. Faithanne endorsed drinking a fifth of vodka on a daily basis for the past two years.   Viviann identified her mother's passing back in June of 2021 as a trigger for her increased depressive symptoms and alcohol use. Lavene also shared that her husband left their marriage back in July of 2022. Estelita stated that that situation also contributed to her increasing depression and alcohol consumption.   Coraleigh reports that she currently lives with her father and 12yo daughter in [place], [place]. 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 Marylyn declined participating in any residential substance abuse treatment programs, however she did express interest in participating in outpatient substance abuse counseling services.   Tequlia reports her main goal is to "not being able to drink". Mariza was started on an Ativan taper, which is scheduled to end on Tuesday, 10/23/21.   LCSW will continue to follow for any additional questions, concerns or identified needs.     Baldo Daub, MSW, LCSW Clinical Child psychotherapist (Facility Based Crisis) [facility]

## 2021-10-18 NOTE — ED Notes (Signed)
Pt is currently sleeping, no distress noted, environmental check complete, will continue to monitor patient for safety. ? ?

## 2021-10-18 NOTE — Progress Notes (Signed)
Tiffany Swanson continued to rest  in her bed throughout the morning without eating breakfast or lunch. She continued to c/o withdrawal symptoms, V S rechecked and medicated per order. No acute distress noted.

## 2021-10-18 NOTE — ED Notes (Signed)
Tiffany Swanson states she doesn't like the [facility] unit and requesting discharge, it was explain that this was only temporary and that she would be going to a private room as soon as a negative COVID PCR was returned from lab. Medication for anxiety was offered and refused Tiffany Swanson at this time remains alert and oriented affect flat mood borderline labile but behavior is self controlled.

## 2021-10-18 NOTE — Group Note (Unsigned)
Group Topic: Recovery Basics  Group Date: 10/18/2021 Start Time: 1100 End Time: 1200 Facilitators: Parks Neptune, NT  Department: [facility]  Number of Participants: 1  Group Focus: acceptance Treatment Modality:  Eclectic Therapy Interventions utilized were clarification Purpose: enhance coping skills   Name: CYNIAH GOSSARD Date of Birth: August 18, 1980  MR: [id number]    Level of Participation: {THERAPIES; PSYCH GROUP PARTICIPATION YOVZC:58850} Quality of Participation: {THERAPIES; PSYCH QUALITY OF PARTICIPATION:23992} Interactions with others: {THERAPIES; PSYCH INTERACTIONS:23993} Mood/Affect: {THERAPIES; PSYCH MOOD/AFFECT:23994} Triggers (if applicable): *** Cognition: {THERAPIES; PSYCH COGNITION:23995} Progress: {THERAPIES; PSYCH PROGRESS:23997} Response: *** Plan: {THERAPIES; PSYCH YDXA:12878}  Patients Problems:  Patient Active Problem List   Diagnosis Date Noted   Alcohol abuse 10/17/2021   Chronic inflammatory arthritis 11/17/2019   HLA B27 positive 11/17/2019   High risk medication use 11/17/2019   Primary osteoarthritis of both feet 11/17/2019   Primary osteoarthritis of both knees 11/17/2019   Anxiety and depression 11/17/2019   Dyslipidemia 11/17/2019   Attention deficit disorder 11/17/2019   History of shingles 11/17/2019   Other insomnia 11/17/2019

## 2021-10-18 NOTE — ED Notes (Signed)
Pt requesting AMA discharge provider notified  and is ok with pt leaving AMA. AMA form signed by patient and witnessed by the writer and placed in chart. Mikel on phone with family members requesting transportation but family members refusing to come pick pt up states she needs to be here. Pt then ambulated to [facility] unit.

## 2021-10-19 ENCOUNTER — Encounter (HOSPITAL_COMMUNITY): Payer: Self-pay

## 2021-10-19 DIAGNOSIS — F1994 Other psychoactive substance use, unspecified with psychoactive substance-induced mood disorder: Secondary | ICD-10-CM | POA: Diagnosis not present

## 2021-10-19 DIAGNOSIS — F1120 Opioid dependence, uncomplicated: Secondary | ICD-10-CM | POA: Diagnosis not present

## 2021-10-19 DIAGNOSIS — F10230 Alcohol dependence with withdrawal, uncomplicated: Secondary | ICD-10-CM | POA: Diagnosis not present

## 2021-10-19 DIAGNOSIS — F339 Major depressive disorder, recurrent, unspecified: Secondary | ICD-10-CM | POA: Diagnosis not present

## 2021-10-19 MED ORDER — LORAZEPAM 1 MG PO TABS
1.0000 mg | ORAL_TABLET | Freq: Every day | ORAL | 0 refills | Status: AC
Start: 2021-10-22 — End: 2021-10-28

## 2021-10-19 NOTE — ED Provider Notes (Signed)
[facility]/OBS ASAP Discharge Summary  Date and Time: 10/19/2021 11:31 AM  Name: Tiffany Swanson  MRN:  [id number]   Discharge Diagnoses:  Final diagnoses:  Alcohol abuse  Anxious mood  Alcohol withdrawal syndrome without complication (HCC)  Substance induced mood disorder (HCC)    Subjective:  Patient seen and chart reviewed-patient is been medication compliant and appropriate with staff and peers on the unit; most recent CIWA 1.  Patient interviewed in conjunction with LCSW this morning patient describes her mood as "better than yesterday" and she displays a brighter affect.  She denies SI/HI/AVH.  She denies alcohol withdrawal symptoms of nausea, vomiting, tremors, headache.  Patient does have mild tremor in bilateral outstretched arms.  Patient requests discharge stating "I have to leave" which she attributes to needing to care for her child as well as returning to her job.  Patient states that she accomplished her goal of "being able to not drink" while she has been at the [facility] and describes it being helpful for her symptoms to be monitored while she has been here as she can now eat and sleep without issue.  Discussed at length the risks of leaving including worsening of withdrawal symptoms, seizure and death and recommended that patient remain in the [facility] and to completion of taper.  Patient verbalized understanding but continued to request discharge.    Return precautions provided to patient.  Discussed with patient that she will be provided with remainder of her Ativan taper and that she is advised not to drink while continuing the taper.  Patient verbalized understanding was in agreement.  Patient states that she has a follow-up with her primary care physician on Sunday through [facility].  Stay Summary:  41 year old female with history of ADHD, alcohol use, and per chart review  opioid dependence, anxiety, and depression who presented to the [facility] on  10/17/2021 for alcohol detox.  Patient had reported consuming 1/5 of liquor daily for the last 2 years.  Patient was admitted to the [facility] and started on CIWA protocol.  EtOH 370; UDS + morphine, oxycodone. On 6/1, CIWA was elevated to a score of 11 and Ativan taper was started on this date.  The following day, 10/19/2021 patient requested discharge.  Last CIWA prior to discharge 1.  At length the recommendation to remain at the [facility] for completion of Ativan taper and monitoring of withdrawal symptoms due to elevated CIWA scores.  Patient verbalized understanding of recommendation to remain at the [facility]; however, patient requested discharge reporting that she needs to care for her child and needs to go back to work.  Was informed of the risks of leaving including worsening of withdrawal symptoms, seizure and death.  Patient verbalized understanding and continued to request discharge and UA.  Patient was discharged per her request after signing AMA form.  Patient was provided with remainder of Ativan taper at discharge.  Total Time spent with patient: 30 minutes  Past Psychiatric History: ADHD, AUD; depression, anxiety and opioid dependence per chart  Past Medical History:  Past Medical History:  Diagnosis Date   ADD (attention deficit disorder)    Anxiety    Depression    Insomnia    Shingles     Past Surgical History:  Procedure Laterality Date   ABDOMINAL HYSTERECTOMY     20 17, 2020   TONSILLECTOMY     age 39   Family History:  Family History  Problem Relation Age of Onset   Rheum  arthritis Mother    Liver disease Mother    Kidney disease Mother    Diabetes Mother    COPD Father    Hypertension Father    Osteoarthritis Father    Rheum arthritis Sister    Osteoarthritis Sister    Healthy Daughter    Healthy Son    Family Psychiatric History:  Denies psychiatric history Denies substance use history Denies history of attempted or completed suicides Social History:  Social History    Substance and Sexual Activity  Alcohol Use Yes   Comment: occ     Social History   Substance and Sexual Activity  Drug Use Never    Social History   Socioeconomic History   Marital status: Married    Spouse name: Not on file   Number of children: Not on file   Years of education: Not on file   Highest education level: Not on file  Occupational History   Not on file  Tobacco Use   Smoking status: Former    Years: 10.00    Types: Cigarettes    Quit date: 2009    Years since quitting: 14.4   Smokeless tobacco: Never  Vaping Use   Vaping Use: Never used  Substance and Sexual Activity   Alcohol use: Yes    Comment: occ   Drug use: Never   Sexual activity: Not on file  Other Topics Concern   Not on file  Social History Narrative   Not on file   Social Determinants of Health   Financial Resource Strain: Not on file  Food Insecurity: Not on file  Transportation Needs: Not on file  Physical Activity: Not on file  Stress: Not on file  Social Connections: Not on file   SDOH:  SDOH Screenings   Alcohol Screen: Not on file  Depression (PHQ2-9): Medium Risk   PHQ-2 Score: 10  Financial Resource Strain: Not on file  Food Insecurity: Not on file  Housing: Not on file  Physical Activity: Not on file  Social Connections: Not on file  Stress: Not on file  Tobacco Use: Not on file  Transportation Needs: Not on file    Tobacco Cessation:  Prescription not provided because: n/a  Current Medications:  Current Facility-Administered Medications  Medication Dose Route Frequency Provider Last Rate Last Admin   acetaminophen (TYLENOL) tablet 650 mg  650 mg Oral Q6H PRN Sindy GuadeloupeWilliams, [name], NP   650 mg at 10/18/21 1242   alum & mag hydroxide-simeth (MAALOX/MYLANTA) 200-200-20 MG/5ML suspension 30 mL  30 mL Oral Q4H PRN Sindy GuadeloupeWilliams, [name], NP       hydrOXYzine (ATARAX) tablet 25 mg  25 mg Oral Q6H PRN Sindy GuadeloupeWilliams, [name], NP   25 mg at 10/19/21 0030   loperamide (IMODIUM) capsule 2-4 mg   2-4 mg Oral PRN Sindy GuadeloupeWilliams, [name], NP       LORazepam (ATIVAN) tablet 1 mg  1 mg Oral Q6H PRN Sindy GuadeloupeWilliams, [name], NP   1 mg at 10/18/21 1242   LORazepam (ATIVAN) tablet 1 mg  1 mg Oral TID Estella HuskLaubach, Akram Kissick S, MD       Followed by   Melene Muller[START ON 10/20/2021] LORazepam (ATIVAN) tablet 1 mg  1 mg Oral BID Estella HuskLaubach, Nimrat Woolworth S, MD       Followed by   Melene Muller[START ON 10/22/2021] LORazepam (ATIVAN) tablet 1 mg  1 mg Oral Daily Estella HuskLaubach, Wylodean Shimmel S, MD       magnesium hydroxide (MILK OF MAGNESIA) suspension 30 mL  30 mL Oral Daily  PRN Sindy Guadeloupe, NP       multivitamin with minerals tablet 1 tablet  1 tablet Oral Daily Sindy Guadeloupe, NP   1 tablet at 10/19/21 1308   nicotine (NICODERM CQ - dosed in mg/24 hours) patch 14 mg  14 mg Transdermal Once Sindy Guadeloupe, NP       ondansetron (ZOFRAN-ODT) disintegrating tablet 4 mg  4 mg Oral Q6H PRN Sindy Guadeloupe, NP   4 mg at 10/18/21 0825   thiamine tablet 100 mg  100 mg Oral Daily Sindy Guadeloupe, NP   100 mg at 10/19/21 6578   Current Outpatient Medications  Medication Sig Dispense Refill   fluticasone (FLONASE) 50 MCG/ACT nasal spray Place 2 sprays into the nose daily as needed for allergies.     amphetamine-dextroamphetamine (ADDERALL XR) 25 MG 24 hr capsule Take 25 mg by mouth in the morning.     amphetamine-dextroamphetamine (ADDERALL) 20 MG tablet Take 20 mg by mouth daily at 12 noon.     [START ON 10/22/2021] LORazepam (ATIVAN) 1 MG tablet Take 1 tablet (1 mg total) by mouth daily for 6 doses. Continue ativan taper. Take 1 mg of ativan at the following times 10/19/21 at 4 pm and 10 pm 10/20/21 at 10 am and 10 pm 10/21/21 at 10 am 10/22/21 at 10 am 6 tablet 0   ondansetron (ZOFRAN) 8 MG tablet Take 8 mg by mouth 3 (three) times daily as needed.     valACYclovir (VALTREX) 1000 MG tablet Take 1,000 mg by mouth daily as needed. For cold sores      PTA Medications: (Not in a hospital admission)   Musculoskeletal  Strength & Muscle Tone: within normal limits Gait & Station:  normal Patient leans: N/A  Psychiatric Specialty Exam  Presentation  General Appearance: Appropriate for Environment; Casual  Eye Contact:Fair  Speech:Clear and Coherent; Normal Rate  Speech Volume:Normal  Handedness:Ambidextrous   Mood and Affect  Mood:-- ("better than yesterday")  Affect:Appropriate; Constricted   Thought Process  Thought Processes:Coherent; Goal Directed; Linear  Descriptions of Associations:Intact  Orientation:Full (Time, Place and Person)  Thought Content:WDL; Logical  Diagnosis of Schizophrenia or Schizoaffective disorder in past: No    Hallucinations:Hallucinations: None  Ideas of Reference:None  Suicidal Thoughts:Suicidal Thoughts: No  Homicidal Thoughts:Homicidal Thoughts: No   Sensorium  Memory:Immediate Good; Recent Good; Remote Good  Judgment:Fair  Insight:Fair   Executive Functions  Concentration:Good  Attention Span:Good  Recall:Good  Fund of Knowledge:Good  Language:Good   Psychomotor Activity  Psychomotor Activity:Psychomotor Activity: Normal  Assets  Assets:Communication Skills; Desire for Improvement; Physical Health; Resilience; Housing; Vocational/Educational   Sleep  Sleep:Sleep: Good  No data recorded  Physical Exam  Physical Exam Constitutional:      Appearance: Normal appearance. She is normal weight.  HENT:     Head: Normocephalic and atraumatic.  Pulmonary:     Effort: Pulmonary effort is normal.  Neurological:     Mental Status: She is alert and oriented to person, place, and time.     Comments: Mild tremor with outstretched hands   Review of Systems  Constitutional:  Negative for chills and fever.  HENT:  Negative for hearing loss.   Eyes:  Negative for discharge and redness.  Respiratory:  Negative for cough.   Cardiovascular:  Negative for chest pain.  Gastrointestinal:  Negative for abdominal pain, nausea and vomiting.  Musculoskeletal:  Negative for myalgias.  Neurological:   Negative for tremors and headaches.  Psychiatric/Behavioral:  Positive for substance abuse. Negative for hallucinations  and suicidal ideas. The patient is not nervous/anxious.   Blood pressure (!) 108/59, pulse (!) 106, temperature 98.7 F (37.1 C), temperature source Tympanic, resp. rate 18, SpO2 98 %. There is no height or weight on file to calculate BMI.  Demographic Factors:  Caucasian  Loss Factors: Loss of significant relationship  Historical Factors: Prior suicide attempts and Impulsivity  Risk Reduction Factors:   Responsible for children under 11 years of age, Sense of responsibility to family, Employed, Living with another person, especially a relative, and Positive social support  Continued Clinical Symptoms:  Depression:   Comorbid alcohol abuse/dependence Alcohol/Substance Abuse/Dependencies Previous Psychiatric Diagnoses and Treatments  Cognitive Features That Contribute To Risk:  Thought constriction (tunnel vision)    Suicide Risk:  Minimal: No identifiable suicidal ideation.  Patients presenting with no risk factors but with morbid ruminations; may be classified as minimal risk based on the severity of the depressive symptoms  Plan Of Care/Follow-up recommendations:  Activity:  as tolerated Diet:  regular Other:    Take all medications as prescribed by his/her mental healthcare provider. Report any adverse effects and or reactions from the medicines to your outpatient provider promptly. Do not engage in alcohol and or illegal drug use while on prescription medicines. In the event of worsening symptoms, call the crisis hotline, 911 and or go to the nearest ED for appropriate evaluation and treatment of symptoms. follow-up with your primary care provider for your other medical issues, concerns and or health care needs.  Allergies as of 10/19/2021       Reactions   Penicillins Anaphylaxis, Hives, Swelling   Sulfa Antibiotics Anaphylaxis, Hives   Ketorolac Other  (See Comments)   Bloody stools        Medication List     STOP taking these medications    clonazePAM 0.5 MG tablet Commonly known as: KLONOPIN   HYDROcodone-acetaminophen 5-325 MG tablet Commonly known as: NORCO/VICODIN       TAKE these medications    amphetamine-dextroamphetamine 25 MG 24 hr capsule Commonly known as: ADDERALL XR Take 25 mg by mouth in the morning.   amphetamine-dextroamphetamine 20 MG tablet Commonly known as: ADDERALL Take 20 mg by mouth daily at 12 noon.   fluticasone 50 MCG/ACT nasal spray Commonly known as: FLONASE Place 2 sprays into the nose daily as needed for allergies.   LORazepam 1 MG tablet Commonly known as: ATIVAN Take 1 tablet (1 mg total) by mouth daily for 6 doses. Continue ativan taper. Take 1 mg of ativan at the following times 10/19/21 at 4 pm and 10 pm 10/20/21 at 10 am and 10 pm 10/21/21 at 10 am 10/22/21 at 10 am Start taking on: October 22, 2021   ondansetron 8 MG tablet Commonly known as: ZOFRAN Take 8 mg by mouth 3 (three) times daily as needed.   valACYclovir 1000 MG tablet Commonly known as: VALTREX Take 1,000 mg by mouth daily as needed. For cold sores        Patient provided with remainder of ativan taper. Patient recommended to continue her outpatient medication as above. Patient with AUD and is on multiple controlled medications on the outpatient basis-will defer  further management to regular outpatient provider  Disposition: self care  Estella Husk, MD 10/19/2021, 11:31 AM

## 2021-10-19 NOTE — Discharge Instructions (Addendum)
Take all medications as prescribed by his/her mental healthcare provider. Report any adverse effects and or reactions from the medicines to your outpatient provider promptly. Do not engage in alcohol and or illegal drug use while on prescription medicines. In the event of worsening symptoms, call the crisis hotline, 911 and or go to the nearest ED for appropriate evaluation and treatment of symptoms. follow-up with your primary care provider for your other medical issues, concerns and or health care needs.  Please continue ativan taper as follows: Take 1 mg of ativan at the following times  10/19/21 at 4 pm and 10 pm  10/20/21 at 10 am and 10 pm  10/21/21 at 10 am  10/22/21 at 10 am  Do not consume alcohol while completing ativan taper. For worsening of withdrawal symptoms, please present to the ED for assistance as alcohol withdrawal can be a medical emergency

## 2021-10-19 NOTE — Clinical Social Work Psych Note (Signed)
LCSW Update/Discharge Note  Fany shared that she wants to be discharged from the [facility] today.   Sumire reports that it is imperative that she leaves today as she does not have any additional support for child care. Kashia also mentioned that she has to return to work in order to take care of her financial responsibilities.   Mignon denied having any SI, HI or AVH. She also denied having any withdrawal symptoms, however she did appear to still have slight tremors, although Keyarra denied having any.   Dr. Bronwen Betters, MD explained to Ahja that it was NOT medically recommended for her to discharge prior to her Atican taper ending. Dr. Bronwen Betters explained the potential medical effects that could result from not completing the Ativan taper protocol. Duane expressed understanding, however expressed that she would still like to be discahrged today.   Chalene agreed to sign herself out AMA today.   Scherry ensured providers that she will continue to follow up with her primiary care physician, Trula Ore at [facility] for medication management services. LCSW provided Amerie with additional resources for outpatient substance abuse counseling and treatment services.   Josy shared that her ride will be to pick her up at 12:00pm today.   LCSW will continue to follow until discharge.   Baldo Daub, MSW, LCSW Clinical Child psychotherapist (Facility Based Crisis) [facility]

## 2021-10-19 NOTE — ED Notes (Signed)
Pt reports she would like to discharge today.  Pt states she is scheduled to work tomorrow and she has a child at home. She denies pain, SI, HI, and AVH.  Pt reports good sleep over night.  Breathing is even and unlabored.  Will continue to monitor for safety.

## 2021-10-19 NOTE — BH IP Treatment Plan (Signed)
Interdisciplinary Treatment and Diagnostic Plan Update  10/19/2021 Time of Session: 10:00AM  Tiffany Swanson MRN: [id number]  Diagnosis:  Final diagnoses:  Alcohol abuse  Anxious mood  Alcohol withdrawal syndrome without complication (HCC)  Substance induced mood disorder (HCC)     Current Medications:  Current Facility-Administered Medications  Medication Dose Route Frequency Provider Last Rate Last Admin   acetaminophen (TYLENOL) tablet 650 mg  650 mg Oral Q6H PRN Sindy Guadeloupe, NP   650 mg at 10/18/21 1242   alum & mag hydroxide-simeth (MAALOX/MYLANTA) 200-200-20 MG/5ML suspension 30 mL  30 mL Oral Q4H PRN Sindy Guadeloupe, NP       hydrOXYzine (ATARAX) tablet 25 mg  25 mg Oral Q6H PRN Sindy Guadeloupe, NP   25 mg at 10/19/21 0030   loperamide (IMODIUM) capsule 2-4 mg  2-4 mg Oral PRN Sindy Guadeloupe, NP       LORazepam (ATIVAN) tablet 1 mg  1 mg Oral Q6H PRN Sindy Guadeloupe, NP   1 mg at 10/18/21 1242   LORazepam (ATIVAN) tablet 1 mg  1 mg Oral TID Estella Husk, MD       Followed by   Melene Muller ON 10/20/2021] LORazepam (ATIVAN) tablet 1 mg  1 mg Oral BID Estella Husk, MD       Followed by   Melene Muller ON 10/22/2021] LORazepam (ATIVAN) tablet 1 mg  1 mg Oral Daily Estella Husk, MD       magnesium hydroxide (MILK OF MAGNESIA) suspension 30 mL  30 mL Oral Daily PRN Sindy Guadeloupe, NP       multivitamin with minerals tablet 1 tablet  1 tablet Oral Daily Sindy Guadeloupe, NP   1 tablet at 10/19/21 0938   nicotine (NICODERM CQ - dosed in mg/24 hours) patch 14 mg  14 mg Transdermal Once Sindy Guadeloupe, NP       ondansetron (ZOFRAN-ODT) disintegrating tablet 4 mg  4 mg Oral Q6H PRN Sindy Guadeloupe, NP   4 mg at 10/18/21 0825   thiamine tablet 100 mg  100 mg Oral Daily Sindy Guadeloupe, NP   100 mg at 10/19/21 1829   Current Outpatient Medications  Medication Sig Dispense Refill   fluticasone (FLONASE) 50 MCG/ACT nasal spray Place 2 sprays into the nose daily as needed for allergies.      amphetamine-dextroamphetamine (ADDERALL XR) 25 MG 24 hr capsule Take 25 mg by mouth in the morning.     amphetamine-dextroamphetamine (ADDERALL) 20 MG tablet Take 20 mg by mouth daily at 12 noon.     clonazePAM (KLONOPIN) 0.5 MG tablet Take 0.5 mg by mouth daily as needed for anxiety.     HYDROcodone-acetaminophen (NORCO/VICODIN) 5-325 MG tablet Take 1 tablet by mouth 3 (three) times daily as needed. She says she takes this twice daily per controlled substance database #15 tabs LF 09/04/21     ondansetron (ZOFRAN) 8 MG tablet Take 8 mg by mouth 3 (three) times daily as needed.     valACYclovir (VALTREX) 1000 MG tablet Take 1,000 mg by mouth daily as needed. For cold sores     PTA Medications: Prior to Admission medications   Medication Sig Start Date End Date Taking? Authorizing Provider  fluticasone (FLONASE) 50 MCG/ACT nasal spray Place 2 sprays into the nose daily as needed for allergies. 09/27/19  Yes [provider]  amphetamine-dextroamphetamine (ADDERALL XR) 25 MG 24 hr capsule Take 25 mg by mouth in the morning. 06/22/15   [provider]  amphetamine-dextroamphetamine (ADDERALL) 20 MG  tablet Take 20 mg by mouth daily at 12 noon. 09/25/21   [provider]  clonazePAM (KLONOPIN) 0.5 MG tablet Take 0.5 mg by mouth daily as needed for anxiety. 07/15/19   [provider]  HYDROcodone-acetaminophen (NORCO/VICODIN) 5-325 MG tablet Take 1 tablet by mouth 3 (three) times daily as needed. She says she takes this twice daily per controlled substance database #15 tabs LF 09/04/21 09/04/21   [provider]  ondansetron (ZOFRAN) 8 MG tablet Take 8 mg by mouth 3 (three) times daily as needed. 09/21/21   [provider]  valACYclovir (VALTREX) 1000 MG tablet Take 1,000 mg by mouth daily as needed. For cold sores 06/30/14   [provider]    Patient Stressors: Financial difficulties   Loss of mother; Patient's mother passed away in June 2021    Marital or family conflict   Substance abuse    Patient Strengths: Motivation for treatment/growth   Treatment Modalities: Medication Management, Group therapy, Case management,  1 to 1 session with clinician, Psychoeducation, Recreational therapy.   Physician Treatment Plan for Primary and Secondary Diagnosis:  Final diagnoses:  Alcohol abuse  Anxious mood  Alcohol withdrawal syndrome without complication (HCC)  Substance induced mood disorder (HCC)   Long Term Goal(s): Improvement in symptoms so as ready for discharge  Short Term Goals: Ability to identify changes in lifestyle to reduce recurrence of condition will improve Ability to verbalize feelings will improve Ability to disclose and discuss suicidal ideas Ability to demonstrate self-control will improve Ability to maintain clinical measurements within normal limits will improve Compliance with prescribed medications will improve  Medication Management: Evaluate patient's response, side effects, and tolerance of medication regimen.  Therapeutic Interventions: 1 to 1 sessions, Unit Group sessions and Medication administration.  Evaluation of Outcomes: Adequate for Discharge  RN Treatment Plan for Primary Diagnosis:  Final diagnoses:  Alcohol abuse  Anxious mood  Alcohol withdrawal syndrome without complication (HCC)  Substance induced mood disorder (HCC)    Long Term Goal(s): Knowledge of disease and therapeutic regimen to maintain health will improve  Short Term Goals: Ability to identify and develop effective coping behaviors will improve and Compliance with prescribed medications will improve  Medication Management: RN will administer medications as ordered by provider, will assess and evaluate patient's response and provide education to patient for prescribed medication. RN will report any adverse and/or side effects to prescribing provider.  Therapeutic Interventions: 1 on 1 counseling sessions,  Psychoeducation, Medication administration, Evaluate responses to treatment, Monitor vital signs and CBGs as ordered, Perform/monitor CIWA, COWS, AIMS and Fall Risk screenings as ordered, Perform wound care treatments as ordered.  Evaluation of Outcomes: Adequate for Discharge   LCSW Treatment Plan for Primary Diagnosis:  Final diagnoses:  Alcohol abuse  Anxious mood  Alcohol withdrawal syndrome without complication (HCC)  Substance induced mood disorder (HCC)    Long Term Goal(s): Safe transition to appropriate next level of care at discharge, Engage patient in therapeutic group addressing interpersonal concerns.  Short Term Goals: Engage patient in aftercare planning with referrals and resources  Therapeutic Interventions: Assess for all discharge needs, 1 to 1 time with Social worker, Explore available resources and support systems, Assess for adequacy in community support network, Educate family and significant other(s) on suicide prevention, Complete Psychosocial Assessment, Interpersonal group therapy.  Evaluation of Outcomes: Adequate for Discharge   Progress in Treatment: Attending groups: No. Participating in groups: No. Taking medication as prescribed: Yes. Toleration medication: Yes. Family/Significant other contact made: No,  will contact:  no one at this time Patient understands diagnosis: Yes. Discussing patient identified problems/goals with staff: Yes. Medical problems stabilized or resolved: Yes. Denies suicidal/homicidal ideation: Yes. Issues/concerns per patient self-inventory: No. Other: None   New problem(s) identified: None   New Short Term/Long Term Goal(s): Tiffany Swanson reports she wants to engage in outpatient substance abuse therapy and treatment services to ensure her sobriety.   Patient Goals:  "Being able to not drink is the main goal I accomplished"  Discharge Plan or Barriers: Tiffany Swanson plans to return home her father and 12yo daughter. She plans to follow up  with her primary care physician at [facility] in [place], [place] for medication management. Tiffany Swanson also shared that she plans to engage in an outpatient substance abuse counseling resource provided to her.   Reason for Continuation of Hospitalization: None  Estimated Length of Stay: D/C 10/19/21  Last 3 [place] Suicide Severity Risk Score: Flowsheet Row ED from 10/17/2021 in [facility]  C-SSRS RISK CATEGORY Low Risk       Last [facility] 2/9 Scores:    10/18/2021    1:36 PM  Depression screen PHQ 2/9  Decreased Interest 0  Down, Depressed, Hopeless 2  PHQ - 2 Score 2  Altered sleeping 3  Tired, decreased energy 3  Change in appetite 3  Feeling bad or failure about yourself  3  Trouble concentrating 3  Moving slowly or fidgety/restless 0  Suicidal thoughts 0  PHQ-9 Score 17    Scribe for Treatment Team: Maeola Sarah, LCSW 10/19/2021 9:35 AM
